# Patient Record
Sex: Male | Born: 1965 | Race: White | Hispanic: No | Marital: Married | Smoking: Never smoker
Health system: Southern US, Community
[De-identification: ages and names within clinical notes are randomized; demographics above are authoritative.]

## PROBLEM LIST (undated history)

## (undated) DIAGNOSIS — E559 Vitamin D deficiency, unspecified: Secondary | ICD-10-CM

## (undated) DIAGNOSIS — F419 Anxiety disorder, unspecified: Secondary | ICD-10-CM

## (undated) DIAGNOSIS — I10 Essential (primary) hypertension: Secondary | ICD-10-CM

## (undated) DIAGNOSIS — E291 Testicular hypofunction: Secondary | ICD-10-CM

## (undated) DIAGNOSIS — E785 Hyperlipidemia, unspecified: Secondary | ICD-10-CM

## (undated) DIAGNOSIS — K5190 Ulcerative colitis, unspecified, without complications: Secondary | ICD-10-CM

## (undated) HISTORY — DX: Ulcerative colitis, unspecified, without complications: K51.90

## (undated) HISTORY — DX: Anxiety disorder, unspecified: F41.9

## (undated) HISTORY — DX: Vitamin D deficiency, unspecified: E55.9

## (undated) HISTORY — DX: Testicular hypofunction: E29.1

## (undated) HISTORY — DX: Essential (primary) hypertension: I10

## (undated) HISTORY — DX: Hyperlipidemia, unspecified: E78.5

---

## 2011-02-10 ENCOUNTER — Ambulatory Visit
Admission: RE | Admit: 2011-02-10 | Discharge: 2011-02-10 | Disposition: A | Discharge status: Home / Self Care | Payer: BC Managed Care – PPO | Source: Ambulatory Visit | Attending: Internal Medicine | Admitting: Internal Medicine

## 2011-02-10 ENCOUNTER — Other Ambulatory Visit: Payer: Self-pay | Admitting: Internal Medicine

## 2011-02-10 DIAGNOSIS — K921 Melena: Secondary | ICD-10-CM

## 2011-02-10 DIAGNOSIS — R1032 Left lower quadrant pain: Secondary | ICD-10-CM

## 2011-02-10 MED ORDER — IOHEXOL 300 MG/ML  SOLN
100.0000 mL | Freq: Once | INTRAMUSCULAR | Status: AC | PRN
  Administered 2011-02-10: 100 mL via INTRAVENOUS

## 2013-02-12 IMAGING — CT CT ABD-PELV W/ CM
1 of 3 series · 14 of 32 positions shown, 19 images · IV contrast ([ID] OMNI 300)
Comparison: None.

CLINICAL DATA: Left lower quadrant pain, blood in stool, history of
colitis

CT ABDOMEN AND PELVIS WITH CONTRAST
TECHNIQUE: Multidetector CT imaging of the abdomen and pelvis was
performed following the standard protocol during bolus
administration of intravenous contrast.
Contrast: 100 ml Ymnipaque-OHH IV

[Series 2: abdomen w/ · axial · 0.83mm/px · z∈[-371,+24]mm · 14 of 88 slices shown, 19 images]
[im 5/88  soft-tissue]
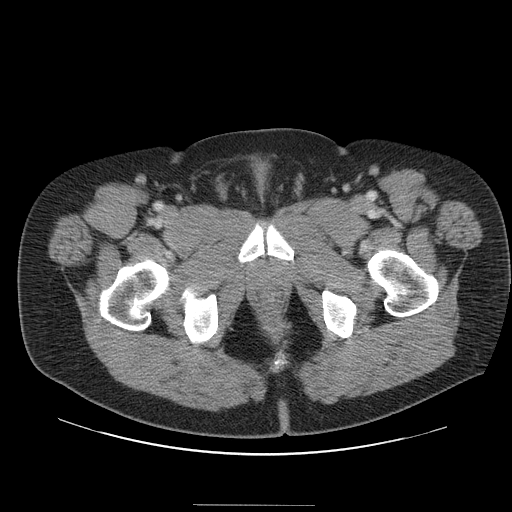
[im 5/88  bone]
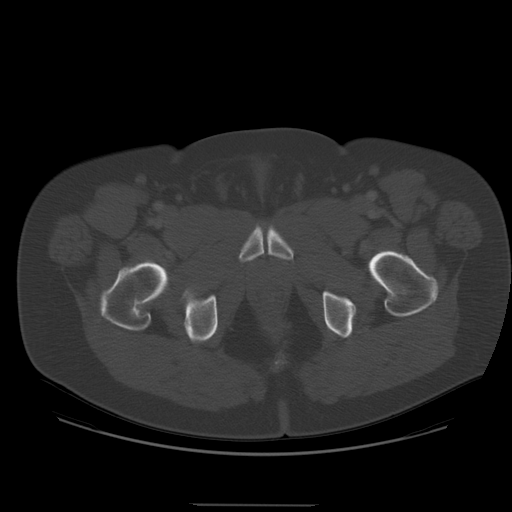
[im 14/88  soft-tissue]
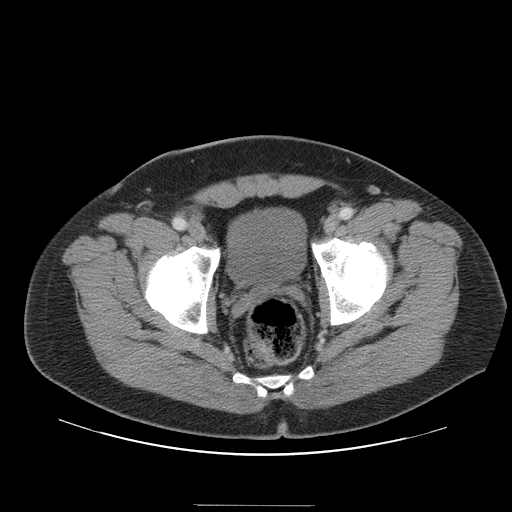
[im 19/88  soft-tissue]
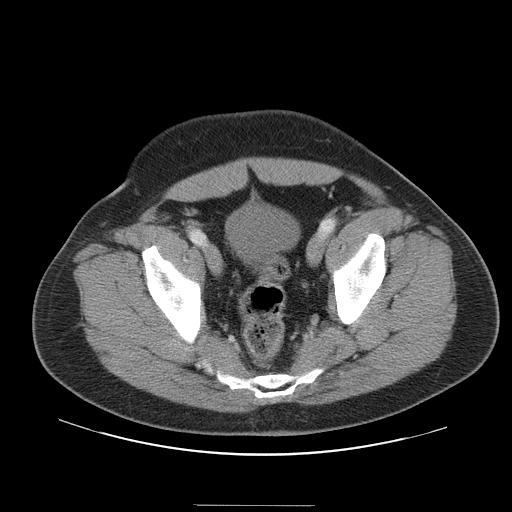
[im 23/88  soft-tissue]
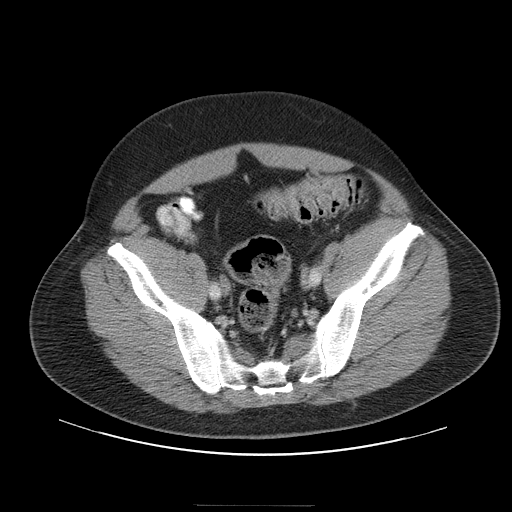
[im 33/88  soft-tissue]
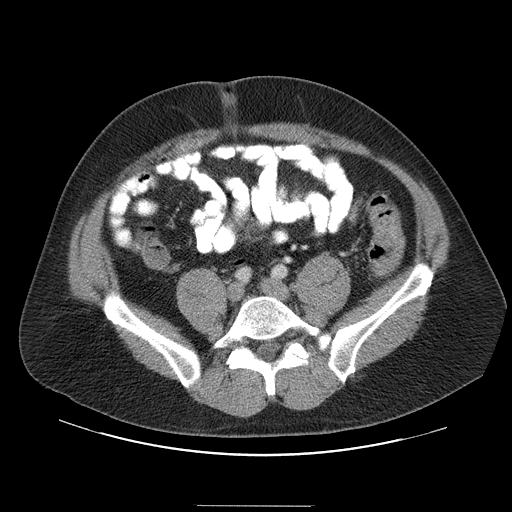
[im 37/88  soft-tissue]
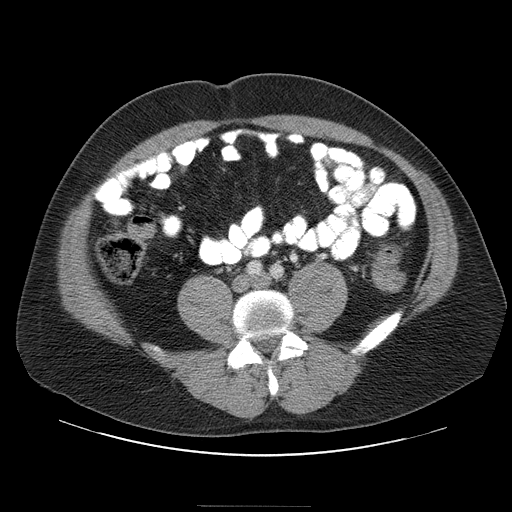
[im 46/88  soft-tissue]
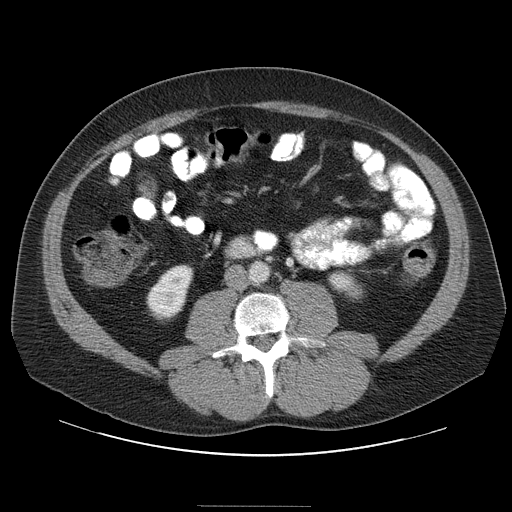
[im 51/88  soft-tissue]
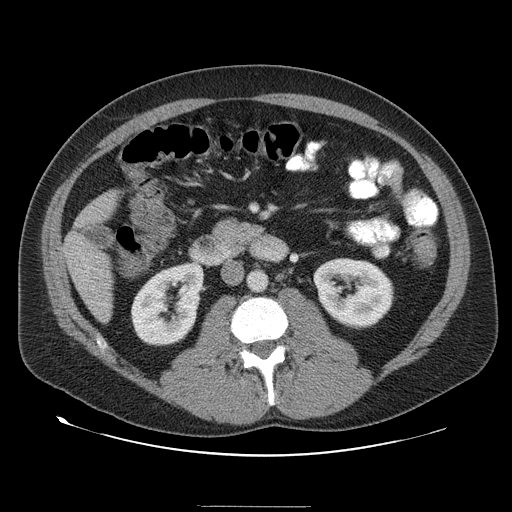
[im 55/88  soft-tissue]
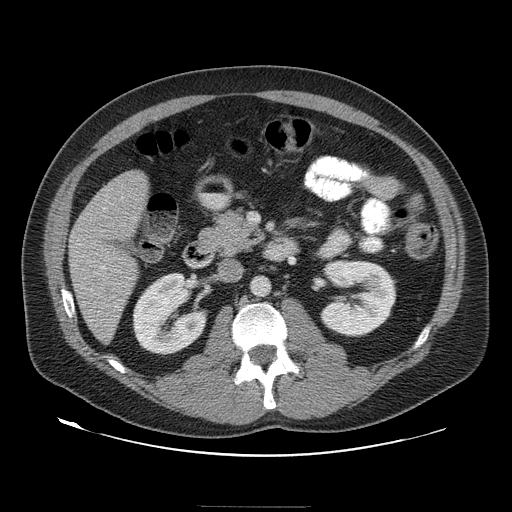
[im 55/88  bone]
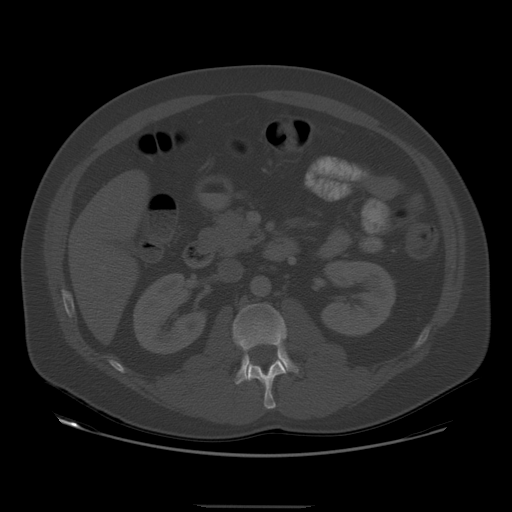
[im 65/88  soft-tissue]
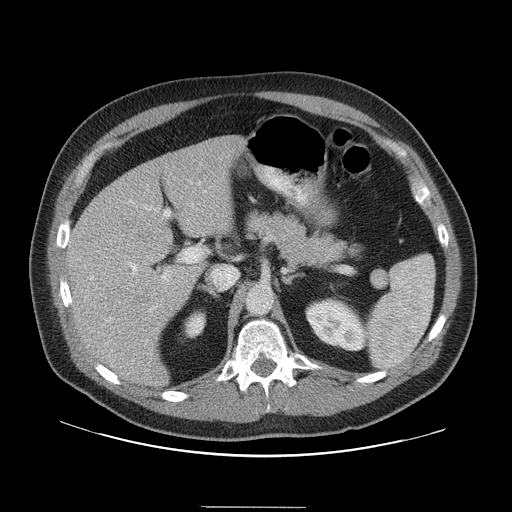
[im 69/88  soft-tissue]
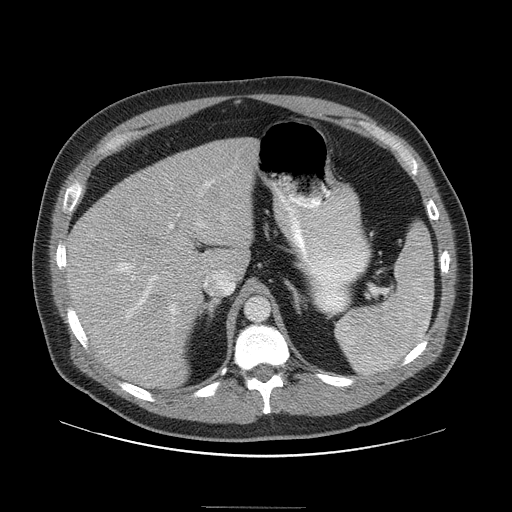
[im 69/88  lung]
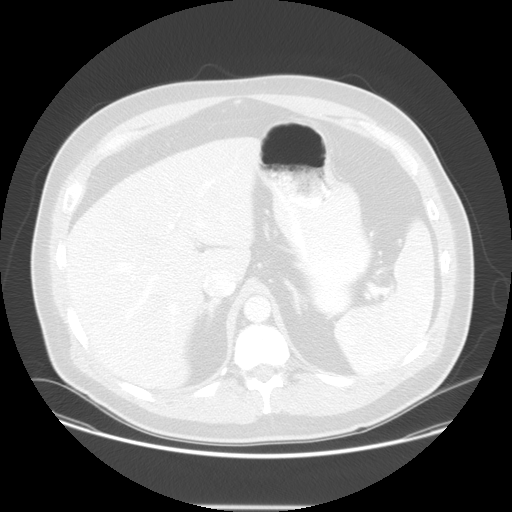
[im 74/88  soft-tissue]
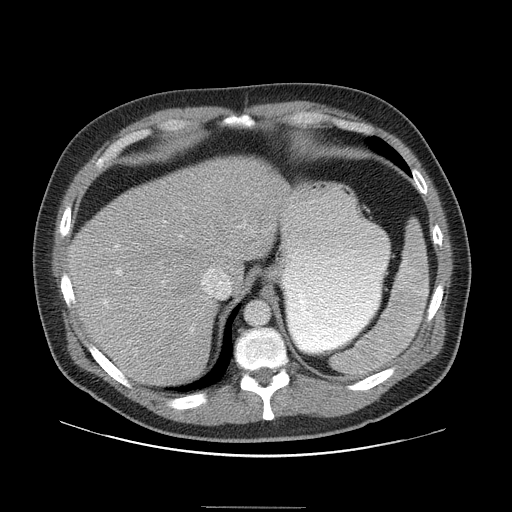
[im 74/88  lung]
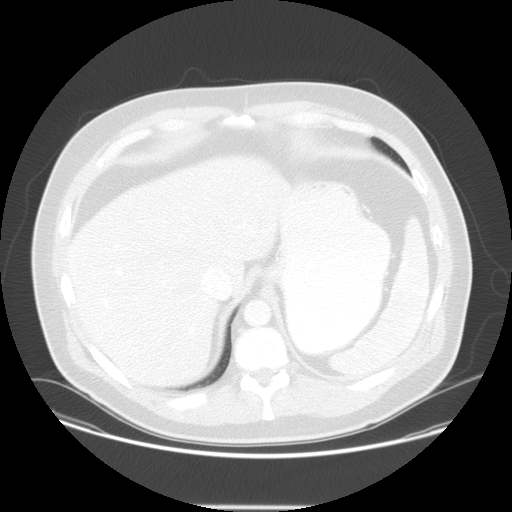
[im 78/88  lung]
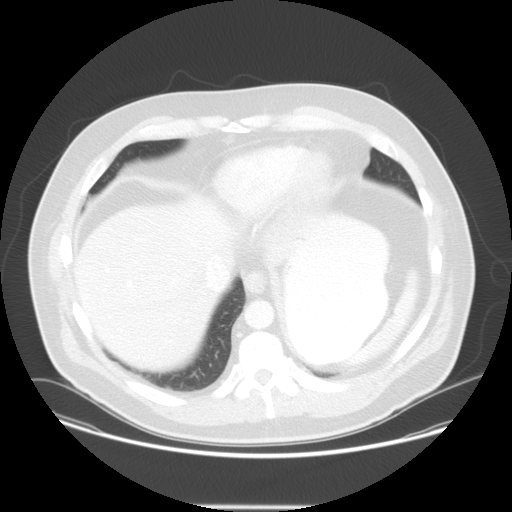
[im 83/88  soft-tissue]
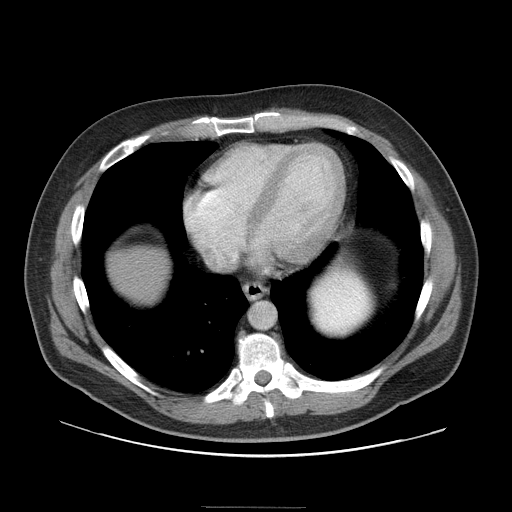
[im 83/88  lung]
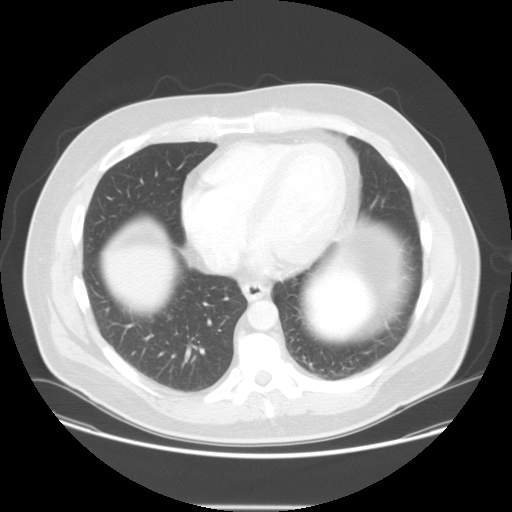

[14 of 32 positions shown; findings below may reference images not displayed]

FINDINGS: Lung bases are essentially clear.

Liver, spleen, pancreas, and adrenal glands are within normal
limits.

Gallbladder is contracted.  No intrahepatic or extrahepatic ductal
dilatation.

Kidneys are within normal limits.  No hydronephrosis.

No evidence of bowel obstruction.  Normal appendix.  Colonic
diverticulosis, without associated inflammatory changes.  No
colonic wall thickening.

No abdominopelvic ascites.  No suspicious abdominopelvic
lymphadenopathy.

No evidence of abdominal aortic aneurysm.

Prostate is unremarkable.

Bladder is within normal limits.

Visualized osseous structures are within normal limits.
IMPRESSION: Colonic diverticulosis, without associated inflammatory changes.
No colonic wall thickening.

No evidence of bowel obstruction.  Normal appendix.

No CT findings to account for the patient's abdominal symptoms.

## 2013-06-05 ENCOUNTER — Encounter: Payer: Self-pay | Admitting: Physician Assistant

## 2013-06-05 DIAGNOSIS — F419 Anxiety disorder, unspecified: Secondary | ICD-10-CM | POA: Insufficient documentation

## 2013-06-05 DIAGNOSIS — E559 Vitamin D deficiency, unspecified: Secondary | ICD-10-CM

## 2013-06-05 DIAGNOSIS — E782 Mixed hyperlipidemia: Secondary | ICD-10-CM | POA: Insufficient documentation

## 2013-06-05 DIAGNOSIS — E785 Hyperlipidemia, unspecified: Secondary | ICD-10-CM

## 2013-06-05 DIAGNOSIS — I10 Essential (primary) hypertension: Secondary | ICD-10-CM | POA: Insufficient documentation

## 2013-06-07 ENCOUNTER — Ambulatory Visit (INDEPENDENT_AMBULATORY_CARE_PROVIDER_SITE_OTHER): Payer: BC Managed Care – PPO | Admitting: Physician Assistant

## 2013-06-07 ENCOUNTER — Encounter: Payer: Self-pay | Admitting: Physician Assistant

## 2013-06-07 VITALS — BP 132/82 | HR 64 | Temp 98.2°F | Resp 16 | Ht 69.0 in | Wt 228.0 lb

## 2013-06-07 DIAGNOSIS — I10 Essential (primary) hypertension: Secondary | ICD-10-CM

## 2013-06-07 DIAGNOSIS — F419 Anxiety disorder, unspecified: Secondary | ICD-10-CM

## 2013-06-07 DIAGNOSIS — E559 Vitamin D deficiency, unspecified: Secondary | ICD-10-CM

## 2013-06-07 DIAGNOSIS — E785 Hyperlipidemia, unspecified: Secondary | ICD-10-CM

## 2013-06-07 MED ORDER — VITAMIN D (ERGOCALCIFEROL) 1.25 MG (50000 UNIT) PO CAPS: 50000.0000 [IU] | ORAL_CAPSULE | ORAL | Status: DC

## 2013-06-07 MED ORDER — PRAVASTATIN SODIUM 80 MG PO TABS: 80.0000 mg | ORAL_TABLET | Freq: Every day | ORAL | Status: DC

## 2013-06-07 MED ORDER — ALPRAZOLAM 1 MG PO TABS: 1.0000 mg | ORAL_TABLET | Freq: Three times a day (TID) | ORAL | Status: DC | PRN

## 2013-06-07 MED ORDER — LISINOPRIL 20 MG PO TABS: 20.0000 mg | ORAL_TABLET | Freq: Every day | ORAL | Status: DC

## 2013-06-07 NOTE — Patient Instructions (Signed)

## 2013-06-07 NOTE — Progress Notes (Signed)
HPI Patient presents for 3 month follow up with hypertension, hyperlipidemia, prediabetes and vitamin D. Patient's blood pressure has been controlled at home. Patient denies chest pain, shortness of breath, dizziness.  Patient's cholesterol is diet controlled. In addition he is on Zetia and pravastatin  and denies myalgias. The cholesterol last visit was 108, trigs 208. Testosterone was 159 last visit, he was not taking testim then but he is on it currently.  Patient is on Vitamin D supplement.  Current Medications:    Medication List       This list is accurate as of: 06/07/13 11:11 AM.  Always use your most recent med list.               ALPRAZolam 1 MG tablet  Commonly known as:  XANAX  Take 1 tablet (1 mg total) by mouth 3 (three) times daily as needed for anxiety.     ezetimibe 10 MG tablet  Commonly known as:  ZETIA  Take 10 mg by mouth daily.     labetalol 200 MG tablet  Commonly known as:  NORMODYNE  Take 200 mg by mouth 2 (two) times daily.     lisinopril 20 MG tablet  Commonly known as:  PRINIVIL,ZESTRIL  Take 1 tablet (20 mg total) by mouth daily.     pravastatin 80 MG tablet  Commonly known as:  PRAVACHOL  Take 1 tablet (80 mg total) by mouth daily. 1/2 daily     sulfaSALAzine 500 MG tablet  Commonly known as:  AZULFIDINE  Take 500 mg by mouth 4 (four) times daily.     TESTIM TD  Place onto the skin.     Vitamin D (Ergocalciferol) 50000 UNITS Caps capsule  Commonly known as:  DRISDOL  Take 1 capsule (50,000 Units total) by mouth every 7 (seven) days.       Medical History:  Past Medical History  Diagnosis Date  . Hypertension   . Hyperlipidemia   . Anxiety   . Vitamin D deficiency   . Colitis, ulcerative   . Hypogonadism male    Allergies:  Allergies  Allergen Reactions  . Crestor [Rosuvastatin]     myalgias  . Lipitor [Atorvastatin]     Myalgias    ROS Constitutional: Denies fever, chills, weight loss/gain, headaches, insomnia, fatigue,  night sweats, and change in appetite. Eyes: Denies redness, blurred vision, diplopia, discharge, itchy, watery eyes.  ENT: Denies discharge, congestion, post nasal drip, sore throat, earache, dental pain, Tinnitus, Vertigo, Sinus pain, snoring.  Cardio: Denies chest pain, palpitations, irregular heartbeat,  dyspnea, diaphoresis, orthopnea, PND, claudication, edema Respiratory: denies cough, dyspnea,pleurisy, hoarseness, wheezing.  Gastrointestinal: Denies dysphagia, heartburn,  water brash, pain, cramps, nausea, vomiting, bloating, diarrhea, constipation, hematemesis, melena, hematochezia,  hemorrhoids Genitourinary: Denies dysuria, frequency, urgency, nocturia, hesitancy, discharge, hematuria, flank pain Musculoskeletal: Denies arthralgia, myalgia, stiffness, Jt. Swelling, pain, limp, and strain/sprain. Skin: Denies pruritis, rash, hives, warts, acne, eczema, changing in skin lesion Neuro: Denies Weakness, tremor, incoordination, spasms, paresthesia, pain Psychiatric: Denies confusion, memory loss, sensory loss Endocrine: Denies change in weight, skin, hair change, nocturia, and paresthesia, Diabetic Polys, Denies visual blurring, hyper /hypo glycemic episodes.  Heme/Lymph: Denies Excessive bleeding, bruising, enlarged lymph nodes  Family history- Review and unchanged Social history- Review and unchanged Physical Exam: Filed Vitals:   06/07/13 1044  BP: 132/82  Pulse: 64  Temp: 98.2 F (36.8 C)  Resp: 16   Filed Weights   06/07/13 1044  Weight: 228 lb (103.42 kg)   General  Appearance: Well nourished, in no apparent distress. Eyes: PERRLA, EOMs, conjunctiva no swelling or erythema, normal fundi and vessels. Sinuses: No Frontal/maxillary tenderness ENT/Mouth: Ext aud canals clear, with TMs without erythema, bulging.No erythema, swelling, or exudate on post pharynx.  Tonsils not swollen or erythematous. Hearing normal.  Neck: Supple, thyroid normal.  Respiratory: Respiratory effort  normal, BS equal bilaterally without rales, rhonchi, wheezing or stridor.  Cardio: Heart sounds normal, regular rate and rhythm without murmurs, rubs or gallops. Peripheral pulses brisk and equal bilaterally, without edema.  Abdomen: Flat, soft, with bowel sounds. Non tender, no guarding, rebound, hernias, masses, or organomegaly.  Lymphatics: Non tender without lymphadenopathy.  Musculoskeletal: Full ROM all peripheral extremities, joint stability, 5/5 strength, and normal gait. Skin: Warm, dry without rashes, lesions, ecchymosis.  Neuro: Cranial nerves intact, reflexes equal bilaterally. Normal muscle tone, no cerebellar symptoms. Sensation intact.  Psych: Awake and oriented X 3, normal affect, Insight and Judgment appropriate.   Assessment and Plan:  Hypertension: Continue medication, monitor blood pressure at home. Continue DASH diet. Cholesterol: Continue diet and exercise. Pre-diabetes-Continue diet and exercise.  Vitamin D Def-  continue medications.  Refill medications and diet counseled.   Zachary Peters 10:56 AM

## 2013-09-13 ENCOUNTER — Ambulatory Visit: Payer: Self-pay | Admitting: Internal Medicine

## 2013-10-11 ENCOUNTER — Ambulatory Visit (INDEPENDENT_AMBULATORY_CARE_PROVIDER_SITE_OTHER): Payer: BC Managed Care – PPO | Admitting: Internal Medicine

## 2013-10-11 ENCOUNTER — Encounter: Payer: Self-pay | Admitting: Internal Medicine

## 2013-10-11 VITALS — BP 110/76 | HR 72 | Temp 98.1°F | Resp 16 | Ht 69.0 in | Wt 224.4 lb

## 2013-10-11 DIAGNOSIS — R7303 Prediabetes: Secondary | ICD-10-CM | POA: Insufficient documentation

## 2013-10-11 DIAGNOSIS — K5190 Ulcerative colitis, unspecified, without complications: Secondary | ICD-10-CM

## 2013-10-11 DIAGNOSIS — E782 Mixed hyperlipidemia: Secondary | ICD-10-CM

## 2013-10-11 DIAGNOSIS — E559 Vitamin D deficiency, unspecified: Secondary | ICD-10-CM | POA: Insufficient documentation

## 2013-10-11 DIAGNOSIS — Z79899 Other long term (current) drug therapy: Secondary | ICD-10-CM

## 2013-10-11 DIAGNOSIS — R7309 Other abnormal glucose: Secondary | ICD-10-CM

## 2013-10-11 DIAGNOSIS — E785 Hyperlipidemia, unspecified: Secondary | ICD-10-CM

## 2013-10-11 DIAGNOSIS — I10 Essential (primary) hypertension: Secondary | ICD-10-CM

## 2013-10-11 DIAGNOSIS — Z1211 Encounter for screening for malignant neoplasm of colon: Secondary | ICD-10-CM | POA: Insufficient documentation

## 2013-10-11 LAB — CBC WITH DIFFERENTIAL/PLATELET
BASOS PCT: 1 % (ref 0–1)
Basophils Absolute: 0.1 10*3/uL (ref 0.0–0.1)
EOS ABS: 0.1 10*3/uL (ref 0.0–0.7)
Eosinophils Relative: 2 % (ref 0–5)
HCT: 42.4 % (ref 39.0–52.0)
Hemoglobin: 14.9 g/dL (ref 13.0–17.0)
Lymphocytes Relative: 31 % (ref 12–46)
Lymphs Abs: 2.2 10*3/uL (ref 0.7–4.0)
MCH: 31.2 pg (ref 26.0–34.0)
MCHC: 35.1 g/dL (ref 30.0–36.0)
MCV: 88.7 fL (ref 78.0–100.0)
MONO ABS: 0.5 10*3/uL (ref 0.1–1.0)
Monocytes Relative: 7 % (ref 3–12)
NEUTROS ABS: 4.1 10*3/uL (ref 1.7–7.7)
NEUTROS PCT: 59 % (ref 43–77)
Platelets: 236 10*3/uL (ref 150–400)
RBC: 4.78 MIL/uL (ref 4.22–5.81)
RDW: 13.4 % (ref 11.5–15.5)
WBC: 7 10*3/uL (ref 4.0–10.5)

## 2013-10-11 MED ORDER — LABETALOL HCL 200 MG PO TABS: 200.0000 mg | ORAL_TABLET | Freq: Two times a day (BID) | ORAL | Status: DC

## 2013-10-11 MED ORDER — ALPRAZOLAM 1 MG PO TABS: 1.0000 mg | ORAL_TABLET | Freq: Three times a day (TID) | ORAL | Status: DC | PRN

## 2013-10-11 MED ORDER — VITAMIN D (ERGOCALCIFEROL) 1.25 MG (50000 UNIT) PO CAPS: 50000.0000 [IU] | ORAL_CAPSULE | Freq: Every day | ORAL | Status: DC

## 2013-10-11 NOTE — Progress Notes (Signed)
Patient ID: Zachary Peters, male   DOB: May 14, 1966, 48 y.o.   MRN: [id number]    This very nice 48 y.o. MWM presents for 6 month follow up with Hypertension, Hyperlipidemia, Pre-Diabetes and Vitamin D Deficiency.    HTN predates since 2003. BP has been controlled at home. Today's BP: 110/76 mmHg . Patient denies any cardiac type chest pain, palpitations, dyspnea/orthopnea/PND, dizziness, claudication, or dependent edema.   Hyperlipidemia is controlled with diet & meds. Last Cholesterol was 201, Triglycerides were 209, HDL 51 and LDL 108 in SEpt 2014 - slightly above goal. Patient denies myalgias or other med SE's.    Also, the patient has moderate obesity (BMI 33.4) and therefore is screened for PreDiabetes and insulin resistance with last A1c of 5.5% in Sept 2014. Patient denies any symptoms of reactive hypoglycemia, diabetic polys, paresthesias or visual blurring.   Other problems include Ulcerative Colitis predating to 1993 and for which he take Azulfadine sporaticaly and infrequently if fhe developes mucoid diarrheal stools. His last colonoscopy was in 2008 with Dr Samuel JesterJLEdwards. Further, the patient has history of Vitamin D Deficiency of 20 in 2008 and with last vitamin D of 55 in Sept 2014. Patient supplements vitamin D without any suspected side-effects. Lastly the patient has Hx/o Testosterone Deficiency , but he states his new wife sayd to inform me that he doesn't have a deficiency!  Medication Sig  . ezetimibe (ZETIA) 10 MG tablet Take 10 mg by mouth daily.  Marland Kitchen. lisinopril (PRINIVIL,ZESTRIL) 20 MG tablet Take 1 tablet (20 mg total) by mouth daily.  . pravastatin (PRAVACHOL) 80 MG tablet Take 1 tablet (80 mg total) by mouth daily. 1/2 daily  . sulfaSALAzine (AZULFIDINE) 500 MG tablet Take 500 mg by mouth 4 (four) times daily. Takes prn   Allergies  Allergen Reactions  . Crestor [Rosuvastatin]     myalgias  . Lipitor [Atorvastatin]     Myalgias   PMHx:   Past Medical History  Diagnosis  Date  . Hypertension   . Hyperlipidemia   . Anxiety   . Vitamin D deficiency   . Colitis, ulcerative   . Hypogonadism male    FHx:    Reviewed / unchanged  SHx:    Reviewed / unchanged   Systems Review: Constitutional: Denies fever, chills, wt changes, headaches, insomnia, fatigue, night sweats, change in appetite. Eyes: Denies redness, blurred vision, diplopia, discharge, itchy, watery eyes.  ENT: Denies discharge, congestion, post nasal drip, epistaxis, sore throat, earache, hearing loss, dental pain, tinnitus, vertigo, sinus pain, snoring.  CV: Denies chest pain, palpitations, irregular heartbeat, syncope, dyspnea, diaphoresis, orthopnea, PND, claudication, edema. Respiratory: denies cough, dyspnea, DOE, pleurisy, hoarseness, laryngitis, wheezing.  Gastrointestinal: Denies dysphagia, odynophagia, heartburn, reflux, water brash, abdominal pain or cramps, nausea, vomiting, bloating, diarrhea, constipation, hematemesis, melena, hematochezia,  or hemorrhoids. Genitourinary: Denies dysuria, frequency, urgency, nocturia, hesitancy, discharge, hematuria, flank pain. Musculoskeletal: Denies arthralgias, myalgias, stiffness, jt. swelling, pain, limp, strain/sprain.  Skin: Denies pruritus, rash, hives, warts, acne, eczema, change in skin lesion(s). Neuro: No weakness, tremor, incoordination, spasms, paresthesia, or pain. Psychiatric: Denies confusion, memory loss, or sensory loss. Endo: Denies change in weight, skin, hair change.  Heme/Lymph: No excessive bleeding, bruising, orenlarged lymph nodes.   Exam:    BP 110/76  Pulse 72  Temp 98.1 F   Resp 16  Ht 5\' 9"    Wt 224 lb 6.4 oz   BMI 33.12 kg/m2  Appears well nourished - in no distress. Eyes: PERRLA, EOMs, conjunctiva no swelling or  erythema. Sinuses: No frontal/maxillary tenderness ENT/Mouth: EAC's clear, TM's nl w/o erythema, bulging. Nares clear w/o erythema, swelling, exudates. Oropharynx clear without erythema or exudates.  Oral hygiene is good. Tongue normal, non obstructing. Hearing intact.  Neck: Supple. Thyroid nl. Car 2+/2+ without bruits, nodes or JVD. Chest: Respirations nl with BS clear & equal w/o rales, rhonchi, wheezing or stridor.  Cor: Heart sounds normal w/ regular rate and rhythm without sig. murmurs, gallops, clicks, or rubs. Peripheral pulses normal and equal  without edema.  Abdomen: Soft & bowel sounds normal. Non-tender w/o guarding, rebound, hernias, masses, or organomegaly.  Lymphatics: Unremarkable.  Musculoskeletal: Full ROM all peripheral extremities, joint stability, 5/5 strength, and normal gait.  Skin: Warm, dry without exposed rashes, lesions, ecchymosis apparent.  Neuro: Cranial nerves intact, reflexes equal bilaterally. Sensory-motor testing grossly intact. Tendon reflexes grossly intact.  Pysch: Alert & oriented x 3. Insight and judgement nl & appropriate. No ideations.  Assessment and Plan:  1. Hypertension - Continue monitor blood pressure at home. Continue diet/meds same.  2. Hyperlipidemia - Continue diet/meds, exercise,& lifestyle modifications. Continue monitor periodic cholesterol/liver & renal functions   3. Pre-diabetes/Insulin Resistance - Continue diet, exercise, lifestyle modifications. Monitor appropriate labs.  4. Vitamin D Deficiency - Continue supplementation.  5. Hx/o Ulcerative Colitis - last colon in 2008 - will query Dr Randa Evens office as to whether he is due surveillance colonoscopy.  Recommended regular exercise, BP monitoring, weight control, and discussed med and SE's. Recommended labs to assess and monitor clinical status. Further disposition pending results of labs.

## 2013-10-11 NOTE — Patient Instructions (Signed)

## 2013-10-12 LAB — HEPATIC FUNCTION PANEL
ALT: 31 U/L (ref 0–53)
AST: 22 U/L (ref 0–37)
Albumin: 4.7 g/dL (ref 3.5–5.2)
Alkaline Phosphatase: 49 U/L (ref 39–117)
Bilirubin, Direct: 0.2 mg/dL (ref 0.0–0.3)
Indirect Bilirubin: 0.7 mg/dL (ref 0.2–1.2)
TOTAL PROTEIN: 6.9 g/dL (ref 6.0–8.3)
Total Bilirubin: 0.9 mg/dL (ref 0.2–1.2)

## 2013-10-12 LAB — BASIC METABOLIC PANEL WITH GFR
BUN: 15 mg/dL (ref 6–23)
CO2: 23 mEq/L (ref 19–32)
Calcium: 9.8 mg/dL (ref 8.4–10.5)
Chloride: 99 mEq/L (ref 96–112)
Creat: 0.73 mg/dL (ref 0.50–1.35)
GFR, Est Non African American: >89 mL/min
Glucose, Bld: 96 mg/dL (ref 70–99)
Potassium: 4.8 mEq/L (ref 3.5–5.3)
SODIUM: 135 meq/L (ref 135–145)

## 2013-10-12 LAB — LIPID PANEL
Cholesterol: 180 mg/dL (ref 0–200)
HDL: 50 mg/dL (ref >39)
LDL CALC: 95 mg/dL (ref 0–99)
Total CHOL/HDL Ratio: 3.6 Ratio
Triglycerides: 176 mg/dL — ABNORMAL HIGH (ref <150)
VLDL: 35 mg/dL (ref 0–40)

## 2013-10-12 LAB — HEMOGLOBIN A1C
Hgb A1c MFr Bld: 5.4 % (ref <5.7)
Mean Plasma Glucose: 108 mg/dL (ref <117)

## 2013-10-12 LAB — MAGNESIUM: Magnesium: 2.2 mg/dL (ref 1.5–2.5)

## 2013-10-12 LAB — TSH: TSH: 1.334 u[IU]/mL (ref 0.350–4.500)

## 2013-10-12 LAB — INSULIN, FASTING: INSULIN FASTING, SERUM: 11 u[IU]/mL (ref 3–28)

## 2013-10-12 LAB — VITAMIN D 25 HYDROXY (VIT D DEFICIENCY, FRACTURES): Vit D, 25-Hydroxy: 51 ng/mL (ref 30–89)

## 2014-01-13 ENCOUNTER — Ambulatory Visit: Payer: Self-pay | Admitting: Emergency Medicine

## 2014-04-01 ENCOUNTER — Encounter: Payer: Self-pay | Admitting: Internal Medicine

## 2014-08-29 ENCOUNTER — Other Ambulatory Visit: Payer: Self-pay | Admitting: *Deleted

## 2014-08-29 ENCOUNTER — Ambulatory Visit: Payer: BLUE CROSS/BLUE SHIELD | Admitting: Internal Medicine

## 2014-08-29 ENCOUNTER — Encounter: Payer: Self-pay | Admitting: Internal Medicine

## 2014-08-29 VITALS — BP 110/70 | HR 60 | Temp 97.7°F | Resp 16 | Ht 70.25 in | Wt 217.6 lb

## 2014-08-29 DIAGNOSIS — R7303 Prediabetes: Secondary | ICD-10-CM

## 2014-08-29 DIAGNOSIS — E785 Hyperlipidemia, unspecified: Secondary | ICD-10-CM

## 2014-08-29 DIAGNOSIS — Z1212 Encounter for screening for malignant neoplasm of rectum: Secondary | ICD-10-CM

## 2014-08-29 DIAGNOSIS — R5383 Other fatigue: Secondary | ICD-10-CM

## 2014-08-29 DIAGNOSIS — Z1211 Encounter for screening for malignant neoplasm of colon: Secondary | ICD-10-CM

## 2014-08-29 DIAGNOSIS — Z125 Encounter for screening for malignant neoplasm of prostate: Secondary | ICD-10-CM

## 2014-08-29 DIAGNOSIS — Z111 Encounter for screening for respiratory tuberculosis: Secondary | ICD-10-CM

## 2014-08-29 DIAGNOSIS — I10 Essential (primary) hypertension: Secondary | ICD-10-CM

## 2014-08-29 DIAGNOSIS — F419 Anxiety disorder, unspecified: Secondary | ICD-10-CM

## 2014-08-29 DIAGNOSIS — E559 Vitamin D deficiency, unspecified: Secondary | ICD-10-CM

## 2014-08-29 DIAGNOSIS — Z79899 Other long term (current) drug therapy: Secondary | ICD-10-CM

## 2014-08-29 LAB — CBC WITH DIFFERENTIAL/PLATELET
Basophils Absolute: 0.1 10*3/uL (ref 0.0–0.1)
Basophils Relative: 1 % (ref 0–1)
EOS ABS: 0.3 10*3/uL (ref 0.0–0.7)
EOS PCT: 4 % (ref 0–5)
HCT: 45.1 % (ref 39.0–52.0)
Hemoglobin: 15.4 g/dL (ref 13.0–17.0)
LYMPHS PCT: 31 % (ref 12–46)
Lymphs Abs: 2.2 10*3/uL (ref 0.7–4.0)
MCH: 30.9 pg (ref 26.0–34.0)
MCHC: 34.1 g/dL (ref 30.0–36.0)
MCV: 90.6 fL (ref 78.0–100.0)
MONO ABS: 0.6 10*3/uL (ref 0.1–1.0)
MPV: 9.6 fL (ref 8.6–12.4)
Monocytes Relative: 9 % (ref 3–12)
Neutro Abs: 3.9 10*3/uL (ref 1.7–7.7)
Neutrophils Relative %: 55 % (ref 43–77)
Platelets: 251 10*3/uL (ref 150–400)
RBC: 4.98 MIL/uL (ref 4.22–5.81)
RDW: 13.6 % (ref 11.5–15.5)
WBC: 7.1 10*3/uL (ref 4.0–10.5)

## 2014-08-29 LAB — BASIC METABOLIC PANEL WITH GFR
BUN: 11 mg/dL (ref 6–23)
CHLORIDE: 101 meq/L (ref 96–112)
CO2: 25 mEq/L (ref 19–32)
CREATININE: 0.84 mg/dL (ref 0.50–1.35)
Calcium: 9.7 mg/dL (ref 8.4–10.5)
GFR, Est African American: >89 mL/min
GFR, Est Non African American: >89 mL/min
Glucose, Bld: 87 mg/dL (ref 70–99)
Potassium: 4.5 mEq/L (ref 3.5–5.3)
Sodium: 138 mEq/L (ref 135–145)

## 2014-08-29 LAB — IRON AND TIBC
%SAT: 27 % (ref 20–55)
Iron: 106 ug/dL (ref 42–165)
TIBC: 397 ug/dL (ref 215–435)
UIBC: 291 ug/dL (ref 125–400)

## 2014-08-29 LAB — LIPID PANEL
Cholesterol: 190 mg/dL (ref 0–200)
HDL: 52 mg/dL (ref >=40)
LDL CALC: 107 mg/dL — AB (ref 0–99)
TRIGLYCERIDES: 156 mg/dL — AB (ref <150)
Total CHOL/HDL Ratio: 3.7 Ratio
VLDL: 31 mg/dL (ref 0–40)

## 2014-08-29 LAB — HEPATIC FUNCTION PANEL
ALBUMIN: 4.9 g/dL (ref 3.5–5.2)
ALT: 25 U/L (ref 0–53)
AST: 17 U/L (ref 0–37)
Alkaline Phosphatase: 57 U/L (ref 39–117)
Bilirubin, Direct: 0.2 mg/dL (ref 0.0–0.3)
Indirect Bilirubin: 1.1 mg/dL (ref 0.2–1.2)
Total Bilirubin: 1.3 mg/dL — ABNORMAL HIGH (ref 0.2–1.2)
Total Protein: 7.6 g/dL (ref 6.0–8.3)

## 2014-08-29 LAB — VITAMIN B12: Vitamin B-12: 569 pg/mL (ref 211–911)

## 2014-08-29 LAB — TSH: TSH: 1.45 u[IU]/mL (ref 0.350–4.500)

## 2014-08-29 LAB — MAGNESIUM: Magnesium: 2.3 mg/dL (ref 1.5–2.5)

## 2014-08-29 MED ORDER — LISINOPRIL 20 MG PO TABS: 20.0000 mg | ORAL_TABLET | Freq: Every day | ORAL | Status: DC

## 2014-08-29 MED ORDER — ALPRAZOLAM 1 MG PO TABS: 1.0000 mg | ORAL_TABLET | Freq: Three times a day (TID) | ORAL | Status: DC | PRN

## 2014-08-29 MED ORDER — PRAVASTATIN SODIUM 80 MG PO TABS: ORAL_TABLET | ORAL | Status: DC

## 2014-08-29 MED ORDER — VITAMIN D (ERGOCALCIFEROL) 1.25 MG (50000 UNIT) PO CAPS: 50000.0000 [IU] | ORAL_CAPSULE | Freq: Every day | ORAL | Status: DC

## 2014-08-29 NOTE — Patient Instructions (Signed)

## 2014-08-30 LAB — URINALYSIS, MICROSCOPIC ONLY
BACTERIA UA: NONE SEEN
CASTS: NONE SEEN
Crystals: NONE SEEN
SQUAMOUS EPITHELIAL / LPF: NONE SEEN

## 2014-08-30 LAB — HEMOGLOBIN A1C
HEMOGLOBIN A1C: 5.2 % (ref <5.7)
Mean Plasma Glucose: 103 mg/dL (ref <117)

## 2014-08-30 LAB — INSULIN, FASTING: Insulin fasting, serum: 4.8 u[IU]/mL (ref 2.0–19.6)

## 2014-08-30 LAB — MICROALBUMIN / CREATININE URINE RATIO
Creatinine, Urine: 164.8 mg/dL
MICROALB UR: 0.6 mg/dL (ref <2.0)
Microalb Creat Ratio: 3.6 mg/g (ref 0.0–30.0)

## 2014-08-30 LAB — PSA: PSA: 0.39 ng/mL (ref <=4.00)

## 2014-08-30 LAB — TESTOSTERONE: Testosterone: 240 ng/dL — ABNORMAL LOW (ref 300–890)

## 2014-08-30 LAB — VITAMIN D 25 HYDROXY (VIT D DEFICIENCY, FRACTURES): VIT D 25 HYDROXY: 31 ng/mL (ref 30–100)

## 2014-08-31 NOTE — Progress Notes (Signed)
Patient ID: Orlie DakinKeith D Cauthon, male   DOB: 1966/01/29, 49 y.o.   MRN: [id number] Annual Comprehensive Examination  This very nice 49 y.o. reMWM presents for complete physical.  Patient has been followed for HTN, Prediabetes, Hyperlipidemia, remote Ulc Colitis and Vitamin D Deficiency.   HTN predates since 2003.  Patient's BP has been controlled at home.Today's BP: 110/70 mmHg. Patient denies any cardiac symptoms as chest pain, palpitations, shortness of breath, dizziness or ankle swelling.   Patient's hyperlipidemia is controlled with diet and medications. Patient denies myalgias or other medication SE's. Last lipids were near goal - Total Chol 190; HDL 52; LDL 107; Trig 156 on 08/29/2014   Patient is screened for prediabetes and patient denies reactive hypoglycemic symptoms, visual blurring, diabetic polys or paresthesias. Last A1c was 5.2% on  08/29/2014.   Finally, patient has history of Vitamin D Deficiency of 20 in and last vitamin D was still very low at 31 on 08/29/2014.  Medication Sig  . ezetimibe (ZETIA) 10 MG tablet Take 10 mg by mouth daily.  Marland Kitchen. labetalol (NORMODYNE) 200 MG tablet Take 1 tablet (200 mg total) by mouth 2 (two) times daily.  Marland Kitchen. ALPRAZolam (XANAX) 1 MG tablet Take 1 tablet (1 mg total) by mouth 3 (three) times daily as needed for anxiety.  Marland Kitchen. lisinopril (PRINIVIL,ZESTRIL) 20 MG tablet Take 1 tablet (20 mg total) by mouth daily.  . pravastatin (PRAVACHOL) 80 MG tablet Take 1 tablet (80 mg total) by mouth daily. 1/2 daily  . Vitamin D, Ergocalciferol, (DRISDOL) 50000 UNITS CAPS capsule Take 1 capsule (50,000 Units total) by mouth daily.  Marland Kitchen. sulfaSALAzine (AZULFIDINE) 500 MG tablet Take 500 mg by mouth 4 (four) times daily. Takes prn   Allergies  Allergen Reactions  . Crestor [Rosuvastatin]     myalgias  . Lipitor [Atorvastatin]     Myalgias   Past Medical History  Diagnosis Date  . Hypertension   . Hyperlipidemia   . Anxiety   . Vitamin D deficiency   . Colitis,  ulcerative   . Hypogonadism male    Health Maintenance  Topic Date Due  . HIV Screening  03/27/1980  . INFLUENZA VACCINE  02/01/2014  . TETANUS/TDAP  07/06/2018   Immunization History  Administered Date(s) Administered  . Pneumococcal-Unspecified 06/05/2002  . Tdap 07/06/2008   Family History  Problem Relation Age of Onset  . Cancer Mother     ocular  . Thyroid disease Mother   . Hypertension Father   . Hyperlipidemia Father    History   Social History  . Marital Status: Unknown    Spouse Name: N/A  . Number of Children: N/A  . Years of Education: N/A   Occupational History  .  medical parts supply inspector   Social History Main Topics  . Smoking status: Never Smoker   . Smokeless tobacco: Never Used  . Alcohol Use: eoccasionally     Comment:   . Drug Use: No  . Sexual Activity: Yes    ROS Constitutional: Denies fever, chills, weight loss/gain, headaches, insomnia, fatigue, night sweats or change in appetite. Eyes: Denies redness, blurred vision, diplopia, discharge, itchy or watery eyes.  ENT: Denies discharge, congestion, post nasal drip, epistaxis, sore throat, earache, hearing loss, dental pain, Tinnitus, Vertigo, Sinus pain or snoring.  Cardio: Denies chest pain, palpitations, irregular heartbeat, syncope, dyspnea, diaphoresis, orthopnea, PND, claudication or edema Respiratory: denies cough, dyspnea, DOE, pleurisy, hoarseness, laryngitis or wheezing.  Gastrointestinal: Denies dysphagia, heartburn, reflux, water brash, pain, cramps, nausea,  vomiting, bloating, diarrhea, constipation, hematemesis, melena, hematochezia, jaundice or hemorrhoids Genitourinary: Denies dysuria, frequency, urgency, nocturia, hesitancy, discharge, hematuria or flank pain Musculoskeletal: Denies arthralgia, myalgia, stiffness, Jt. Swelling, pain, limp or strain/sprain. Denies Falls. Skin: Denies puritis, rash, hives, warts, acne, eczema or change in skin lesion Neuro: No weakness,  tremor, incoordination, spasms, paresthesia or pain Psychiatric: Denies confusion, memory loss or sensory loss. Denies Depression. Endocrine: Denies change in weight, skin, hair change, nocturia, and paresthesia, diabetic polys, visual blurring or hyper / hypo glycemic episodes.  Heme/Lymph: No excessive bleeding, bruising or enlarged lymph nodes.  Physical Exam  BP 110/70   Pulse 60  Temp 97.7 F   Resp 16  Ht 5' 10.25" Wt 217 lb 9.6 oz     BMI 31.01   General Appearance: Well nourished, in no apparent distress. Eyes: PERRLA, EOMs, conjunctiva no swelling or erythema, normal fundi and vessels. Sinuses: No frontal/maxillary tenderness ENT/Mouth: EACs patent / TMs  nl. Nares clear without erythema, swelling, mucoid exudates. Oral hygiene is good. No erythema, swelling, or exudate. Tongue normal, non-obstructing. Tonsils not swollen or erythematous. Hearing normal.  Neck: Supple, thyroid normal. No bruits, nodes or JVD. Respiratory: Respiratory effort normal.  BS equal and clear bilateral without rales, rhonci, wheezing or stridor. Cardio: Heart sounds are normal with regular rate and rhythm and no murmurs, rubs or gallops. Peripheral pulses are normal and equal bilaterally without edema. No aortic or femoral bruits. Chest: symmetric with normal excursions and percussion.  Abdomen: Flat, soft, with bowl sounds. Nontender, no guarding, rebound, hernias, masses, or organomegaly.  Lymphatics: Non tender without lymphadenopathy.  Genitourinary: No hernias.Testes nl. DRE - prostate nl for age - smooth & firm w/o nodules. Musculoskeletal: Full ROM all peripheral extremities, joint stability, 5/5 strength, and normal gait. Skin: Warm and dry without rashes, lesions, cyanosis, clubbing or  ecchymosis.  Neuro: Cranial nerves intact, reflexes equal bilaterally. Normal muscle tone, no cerebellar symptoms. Sensation intact.  Pysch: Awake and oriented X 3 with normal affect, insight and judgment  appropriate.   Assessment and Plan  1. Essential hypertension  - Microalbumin / creatinine urine ratio - EKG 12-Lead - [place], RETROPERITNL ABD,  LTD - TSH  2. Hyperlipidemia  - Lipid panel  3. Prediabetes  - Hemoglobin A1c - Insulin, fasting  4. Vitamin D deficiency  - Vit D  25 hydroxy (rtn osteoporosis monitoring)  5. Anxiety   6. Screening for rectal cancer  - POC Hemoccult Bld/Stl (3-Cd Home Screen); Future  7. Prostate cancer screening  - PSA  8. Medication management - Urine Microscopic - CBC with Differential/Platelet - BASIC METABOLIC PANEL WITH GFR - Hepatic function panel - Magnesium  9. Other fatigue  - Vitamin B12 - Testosterone - Iron and TIBC   Continue prudent diet as discussed, weight control, BP monitoring, regular exercise, and medications as discussed.  Discussed med effects and SE's. Routine screening labs and tests as requested with regular follow-up as recommended.

## 2014-09-01 ENCOUNTER — Telehealth: Payer: Self-pay | Admitting: *Deleted

## 2014-09-01 NOTE — Telephone Encounter (Signed)
caleed and requested verification of Vitamin D 1610950000 units daily.  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  Left message to inform [facility].

## 2014-09-01 NOTE — Addendum Note (Signed)
Addended by: Valrie HartEVANS, Levie Owensby C on: 09/01/2014 09:40 AM   Modules accepted: Orders

## 2014-09-04 LAB — TB SKIN TEST
Induration: 0 mm
TB SKIN TEST: NEGATIVE

## 2014-09-17 ENCOUNTER — Other Ambulatory Visit: Payer: Self-pay | Admitting: *Deleted

## 2014-09-17 DIAGNOSIS — Z1212 Encounter for screening for malignant neoplasm of rectum: Secondary | ICD-10-CM

## 2014-09-17 LAB — POC HEMOCCULT BLD/STL (HOME/3-CARD/SCREEN)
Card #2 Fecal Occult Blod, POC: NEGATIVE
Card #3 Fecal Occult Blood, POC: NEGATIVE
FECAL OCCULT BLD: NEGATIVE

## 2014-09-19 ENCOUNTER — Ambulatory Visit (INDEPENDENT_AMBULATORY_CARE_PROVIDER_SITE_OTHER): Payer: BLUE CROSS/BLUE SHIELD | Admitting: Internal Medicine

## 2014-09-19 ENCOUNTER — Encounter: Payer: Self-pay | Admitting: Internal Medicine

## 2014-09-19 VITALS — BP 140/88 | HR 62 | Temp 98.2°F | Resp 18 | Ht 70.25 in | Wt 222.0 lb

## 2014-09-19 DIAGNOSIS — R300 Dysuria: Secondary | ICD-10-CM

## 2014-09-19 MED ORDER — CIPROFLOXACIN HCL 500 MG PO TABS: 500.0000 mg | ORAL_TABLET | Freq: Two times a day (BID) | ORAL | Status: DC

## 2014-09-19 NOTE — Progress Notes (Signed)
   Subjective:    Patient ID: Zachary Peters, male    DOB: July 26, 1965, 49 y.o.   MRN: [id number]  Dysuria  Associated symptoms include chills, frequency, nausea and urgency. Pertinent negatives include no hematuria or vomiting.  Patient is a [age] y.o. Male who presents to the office for evaluation of dysuria since Monday.  Patient reports that his urine smells very strong.  He reports that he has the urge to go and he also reports that his urine has had a very strong smell.  He reports some slight burning as he urinates.  He has been taking Azo at home with some relief.  He reports a little bit of nausea but no vomiting.  He reports that his urine has been cloudy.  He reports that he had a UTI 2 to three years ago.  He reports some dribbling after urinating.  He denies penile discharge.  He does report some rectal discomfort when he is sitting.  He also reports some mild back pain which has since resolved.    Review of Systems  Constitutional: Positive for chills. Negative for fever and fatigue.  Gastrointestinal: Positive for nausea and diarrhea. Negative for vomiting, abdominal pain and constipation.  Genitourinary: Positive for dysuria, urgency, frequency and difficulty urinating. Negative for hematuria, decreased urine volume, discharge, penile pain and testicular pain.  All other systems reviewed and are negative.      Objective:   Physical Exam  Constitutional: He is oriented to person, place, and time. He appears well-developed and well-nourished. No distress.  HENT:  Head: Normocephalic and atraumatic.  Mouth/Throat: Oropharynx is clear and moist. No oropharyngeal exudate.  Eyes: Conjunctivae and EOM are normal. Pupils are equal, round, and reactive to light. No scleral icterus.  Neck: Normal range of motion. Neck supple. No JVD present. No thyromegaly present.  Cardiovascular: Normal rate, regular rhythm, normal heart sounds and intact distal pulses.   Pulmonary/Chest: Effort normal  and breath sounds normal. No respiratory distress. He has no wheezes. He has no rales. He exhibits no tenderness.  Abdominal: Soft. Normal appearance and bowel sounds are normal. He exhibits no distension and no mass. There is no tenderness. There is no rigidity, no rebound, no guarding, no CVA tenderness, no tenderness at McBurney's point and negative Murphy's sign.  Musculoskeletal: Normal range of motion.  Lymphadenopathy:    He has no cervical adenopathy.  Neurological: He is alert and oriented to person, place, and time.  Skin: Skin is warm and dry. He is not diaphoretic.  Psychiatric: He has a normal mood and affect. His behavior is normal. Judgment and thought content normal.  Nursing note and vitals reviewed.   Filed Vitals:   09/19/14 0915  BP: 140/88  Pulse: 62  Temp: 98.2 F (36.8 C)  Resp: 18          Assessment & Plan:    1. Dysuria  Ddx includes UTI, prostatitis, or kidney stone.  Will check urine and obtain culture.  Will treat with cipro.   Pt. Can take azo as needed.  Will check GC probe for possible urethritis but low risk given history.    - ciprofloxacin (CIPRO) 500 MG tablet; Take 1 tablet (500 mg total) by mouth 2 (two) times daily.  Dispense: 56 tablet; Refill: 0 - Urinalysis, Routine w reflex microscopic - Urine culture - GC/chlamydia probe amp, urine

## 2014-09-19 NOTE — Patient Instructions (Signed)

## 2014-09-20 LAB — URINALYSIS, ROUTINE W REFLEX MICROSCOPIC
BILIRUBIN URINE: NEGATIVE
Glucose, UA: NEGATIVE mg/dL
Hgb urine dipstick: NEGATIVE
Ketones, ur: NEGATIVE mg/dL
LEUKOCYTES UA: NEGATIVE
NITRITE: NEGATIVE
Protein, ur: NEGATIVE mg/dL
SPECIFIC GRAVITY, URINE: 1.02 (ref 1.005–1.030)
Urobilinogen, UA: 0.2 mg/dL (ref 0.0–1.0)
pH: 5 (ref 5.0–8.0)

## 2014-09-20 LAB — GC/CHLAMYDIA PROBE AMP, URINE
Chlamydia, Swab/Urine, PCR: NEGATIVE
GC Probe Amp, Urine: NEGATIVE

## 2014-09-21 LAB — URINE CULTURE
Colony Count: NO GROWTH
ORGANISM ID, BACTERIA: NO GROWTH

## 2014-12-05 ENCOUNTER — Ambulatory Visit: Payer: Self-pay | Admitting: Internal Medicine

## 2015-03-13 ENCOUNTER — Ambulatory Visit: Payer: Self-pay | Admitting: Internal Medicine

## 2015-09-04 ENCOUNTER — Ambulatory Visit (INDEPENDENT_AMBULATORY_CARE_PROVIDER_SITE_OTHER): Payer: BLUE CROSS/BLUE SHIELD | Admitting: Internal Medicine

## 2015-09-04 ENCOUNTER — Encounter: Payer: Self-pay | Admitting: Internal Medicine

## 2015-09-04 ENCOUNTER — Other Ambulatory Visit: Payer: Self-pay | Admitting: *Deleted

## 2015-09-04 VITALS — BP 130/72 | HR 64 | Temp 97.3°F | Resp 16 | Ht 69.5 in | Wt 218.6 lb

## 2015-09-04 DIAGNOSIS — Z125 Encounter for screening for malignant neoplasm of prostate: Secondary | ICD-10-CM

## 2015-09-04 DIAGNOSIS — Z0000 Encounter for general adult medical examination without abnormal findings: Secondary | ICD-10-CM

## 2015-09-04 DIAGNOSIS — I10 Essential (primary) hypertension: Secondary | ICD-10-CM

## 2015-09-04 DIAGNOSIS — E785 Hyperlipidemia, unspecified: Secondary | ICD-10-CM

## 2015-09-04 DIAGNOSIS — Z111 Encounter for screening for respiratory tuberculosis: Secondary | ICD-10-CM

## 2015-09-04 DIAGNOSIS — R5383 Other fatigue: Secondary | ICD-10-CM

## 2015-09-04 DIAGNOSIS — Z0001 Encounter for general adult medical examination with abnormal findings: Secondary | ICD-10-CM

## 2015-09-04 DIAGNOSIS — Z79899 Other long term (current) drug therapy: Secondary | ICD-10-CM | POA: Diagnosis not present

## 2015-09-04 DIAGNOSIS — E559 Vitamin D deficiency, unspecified: Secondary | ICD-10-CM | POA: Diagnosis not present

## 2015-09-04 DIAGNOSIS — R7303 Prediabetes: Secondary | ICD-10-CM

## 2015-09-04 DIAGNOSIS — Z1212 Encounter for screening for malignant neoplasm of rectum: Secondary | ICD-10-CM

## 2015-09-04 LAB — CBC WITH DIFFERENTIAL/PLATELET
BASOS PCT: 1 % (ref 0–1)
Basophils Absolute: 0.1 10*3/uL (ref 0.0–0.1)
Eosinophils Absolute: 0.3 10*3/uL (ref 0.0–0.7)
Eosinophils Relative: 5 % (ref 0–5)
HCT: 42.7 % (ref 39.0–52.0)
HEMOGLOBIN: 14.8 g/dL (ref 13.0–17.0)
LYMPHS ABS: 2.1 10*3/uL (ref 0.7–4.0)
Lymphocytes Relative: 33 % (ref 12–46)
MCH: 31.5 pg (ref 26.0–34.0)
MCHC: 34.7 g/dL (ref 30.0–36.0)
MCV: 90.9 fL (ref 78.0–100.0)
MONO ABS: 0.5 10*3/uL (ref 0.1–1.0)
MONOS PCT: 8 % (ref 3–12)
MPV: 9.7 fL (ref 8.6–12.4)
NEUTROS ABS: 3.4 10*3/uL (ref 1.7–7.7)
NEUTROS PCT: 53 % (ref 43–77)
Platelets: 228 10*3/uL (ref 150–400)
RBC: 4.7 MIL/uL (ref 4.22–5.81)
RDW: 13 % (ref 11.5–15.5)
WBC: 6.4 10*3/uL (ref 4.0–10.5)

## 2015-09-04 LAB — URINALYSIS, ROUTINE W REFLEX MICROSCOPIC
Bilirubin Urine: NEGATIVE
Glucose, UA: NEGATIVE
Hgb urine dipstick: NEGATIVE
KETONES UR: NEGATIVE
Leukocytes, UA: NEGATIVE
NITRITE: NEGATIVE
PROTEIN: NEGATIVE
Specific Gravity, Urine: 1.013 (ref 1.001–1.035)
pH: 6 (ref 5.0–8.0)

## 2015-09-04 LAB — LIPID PANEL
CHOL/HDL RATIO: 3.6 ratio (ref <=5.0)
CHOLESTEROL: 178 mg/dL (ref 125–200)
HDL: 49 mg/dL (ref >=40)
LDL Cholesterol: 97 mg/dL (ref <130)
Triglycerides: 160 mg/dL — ABNORMAL HIGH (ref <150)
VLDL: 32 mg/dL — ABNORMAL HIGH (ref <30)

## 2015-09-04 LAB — HEPATIC FUNCTION PANEL
ALBUMIN: 4.8 g/dL (ref 3.6–5.1)
ALT: 30 U/L (ref 9–46)
AST: 21 U/L (ref 10–35)
Alkaline Phosphatase: 47 U/L (ref 40–115)
Bilirubin, Direct: 0.2 mg/dL (ref <=0.2)
Indirect Bilirubin: 0.7 mg/dL (ref 0.2–1.2)
TOTAL PROTEIN: 7.2 g/dL (ref 6.1–8.1)
Total Bilirubin: 0.9 mg/dL (ref 0.2–1.2)

## 2015-09-04 LAB — BASIC METABOLIC PANEL WITH GFR
BUN: 13 mg/dL (ref 7–25)
CALCIUM: 9.7 mg/dL (ref 8.6–10.3)
CHLORIDE: 100 mmol/L (ref 98–110)
CO2: 26 mmol/L (ref 20–31)
CREATININE: 0.76 mg/dL (ref 0.70–1.33)
GFR, Est African American: >89 mL/min (ref >=60)
GFR, Est Non African American: >89 mL/min (ref >=60)
GLUCOSE: 95 mg/dL (ref 65–99)
Potassium: 4.3 mmol/L (ref 3.5–5.3)
Sodium: 137 mmol/L (ref 135–146)

## 2015-09-04 LAB — IRON AND TIBC
%SAT: 20 % (ref 15–60)
IRON: 78 ug/dL (ref 50–180)
TIBC: 388 ug/dL (ref 250–425)
UIBC: 310 ug/dL (ref 125–400)

## 2015-09-04 LAB — MAGNESIUM: MAGNESIUM: 2.2 mg/dL (ref 1.5–2.5)

## 2015-09-04 MED ORDER — VITAMIN D (ERGOCALCIFEROL) 1.25 MG (50000 UNIT) PO CAPS: 50000.0000 [IU] | ORAL_CAPSULE | Freq: Every day | ORAL | Status: DC

## 2015-09-04 MED ORDER — LISINOPRIL 20 MG PO TABS: 20.0000 mg | ORAL_TABLET | Freq: Every day | ORAL | Status: DC

## 2015-09-04 MED ORDER — PRAVASTATIN SODIUM 80 MG PO TABS: ORAL_TABLET | ORAL | Status: DC

## 2015-09-04 MED ORDER — ALPRAZOLAM 1 MG PO TABS: 1.0000 mg | ORAL_TABLET | Freq: Three times a day (TID) | ORAL | Status: DC | PRN

## 2015-09-04 MED ORDER — EZETIMIBE 10 MG PO TABS: 10.0000 mg | ORAL_TABLET | Freq: Every day | ORAL | Status: DC

## 2015-09-04 MED ORDER — LABETALOL HCL 200 MG PO TABS: 200.0000 mg | ORAL_TABLET | Freq: Two times a day (BID) | ORAL | Status: DC

## 2015-09-04 NOTE — Patient Instructions (Signed)

## 2015-09-04 NOTE — Progress Notes (Signed)
Patient ID: Zachary Peters, male   DOB: May 02, 1966, 50 y.o.   MRN: [id number]  Annual  Screening/Preventative Visit And Comprehensive Evaluation & Examination     This very nice 50 y.o. MWM  presents for a Preventative Visit & comprehensive evaluation and management of multiple medical co-morbidities.  Patient has been followed for HTN, Prediabetes, Hyperlipidemia and Vitamin D Deficiency. Patient has been lost to recommended f/u over the last year.      HTN predates since 2003. Patient's BP has been controlled at home.Today's BP: 130/72 mmHg. Patient denies any cardiac symptoms as chest pain, palpitations, shortness of breath, dizziness or ankle swelling.     Patient's hyperlipidemia is controlled with diet and medications. Patient denies myalgias or other medication SE's. Last lipids were T Chol 190, HDL 52, Trig 156 and LDL 107 in Feb 2016.      Patient has prediabetes since    and patient denies reactive hypoglycemic symptoms, visual blurring, diabetic polys or paresthesias. Last A1c was 5.2% in Feb 2016.     Finally, patient has history of Vitamin D Deficiency of "20" in 2008 and last vitamin D was still very low at 6131 in Feb 2016.   Medication Sig  . ALPRAZolam (XANAX) 1 MG tablet Take 1 tablet (1 mg total) by mouth 3 (three) times daily as needed for anxiety.  Marland Kitchen. ezetimibe (ZETIA) 10 MG tablet Take 10 mg by mouth daily.  Marland Kitchen. labetalol (NORMODYNE) 200 MG tablet Take 1 tablet (200 mg total) by mouth 2 (two) times daily.  Marland Kitchen. lisinopril (PRINIVIL,ZESTRIL) 20 MG tablet Take 1 tablet (20 mg total) by mouth daily.  . pravastatin 80 MG tablet Take 1 tablet daily for cholesterol  . Vitamin D 50,000 UNITS  Take 1 capsule (50,000 Units total) by mouth daily.   Allergies  Allergen Reactions  . Crestor [Rosuvastatin]     myalgias  . Lipitor [Atorvastatin]     Myalgias   Past Medical History  Diagnosis Date  . Hypertension   . Hyperlipidemia   . Anxiety   . Vitamin D deficiency   . Colitis,  ulcerative   . Hypogonadism male    Health Maintenance  Topic Date Due  . HIV Screening  03/27/1980  . INFLUENZA VACCINE  02/02/2015  . TETANUS/TDAP  07/06/2018  . COLONOSCOPY  05/11/2023   Immunization History  Administered Date(s) Administered  . PPD Test 08/29/2014, 09/04/2015  . Pneumococcal-Unspecified 06/05/2002  . Tdap 07/06/2008   No past surgical history on file.   Family History  Problem Relation Age of Onset  . Cancer Mother     ocular  . Thyroid disease Mother   . Hypertension Father   . Hyperlipidemia Father    Social History   Social History  . Marital Status: Unknown    Spouse Name: N/A  . Number of Children: 1 natural son & 2 adopted sons  . Years of Education: N/A   Occupational History  . 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   Social History Main Topics  . Smoking status: Never Smoker   . Smokeless tobacco: Never Used  . Alcohol Use: 0.0 oz/week    0 Standard drinks or equivalent per week     Comment: occasionally  . Drug Use: No  . Sexual Activity: Yes    ROS Constitutional: Denies fever, chills, weight loss/gain, headaches, insomnia,  night sweats or change in appetite. Does c/o fatigue. Eyes: Denies redness, blurred vision, diplopia, discharge, itchy or watery eyes.  ENT: Denies discharge, congestion, post nasal drip, epistaxis, sore throat, earache, hearing loss, dental pain, Tinnitus, Vertigo, Sinus pain or snoring.  Cardio: Denies chest pain, palpitations, irregular heartbeat, syncope, dyspnea, diaphoresis, orthopnea, PND, claudication or edema Respiratory: denies cough, dyspnea, DOE, pleurisy, hoarseness, laryngitis or wheezing.  Gastrointestinal: Denies dysphagia, heartburn, reflux, water brash, pain, cramps, nausea, vomiting, bloating, diarrhea, constipation, hematemesis, melena, hematochezia, jaundice or hemorrhoids Genitourinary: Denies dysuria, frequency, urgency, nocturia, hesitancy,  discharge, hematuria or flank pain Musculoskeletal: Denies arthralgia, myalgia, stiffness, Jt. Swelling, pain, limp or strain/sprain. Denies Falls. Skin: Denies puritis, rash, hives, warts, acne, eczema or change in skin lesion Neuro: No weakness, tremor, incoordination, spasms, paresthesia or pain Psychiatric: Denies confusion, memory loss or sensory loss. Denies Depression. Endocrine: Denies change in weight, skin, hair change, nocturia, and paresthesia, diabetic polys, visual blurring or hyper / hypo glycemic episodes.  Heme/Lymph: No excessive bleeding, bruising or enlarged lymph nodes.  Physical Exam  BP 130/72 mmHg  Pulse 64  Temp(Src) 97.3 F (36.3 C)  Resp 16  Ht 5' 9.5" (1.765 m)  Wt 218 lb 9.6 oz (99.156 kg)  BMI 31.83 kg/m2  General Appearance: Well nourished, in no apparent distress . Eyes: PERRLA, EOMs, conjunctiva no swelling or erythema, normal fundi and vessels. Sinuses: No frontal/maxillary tenderness ENT/Mouth: EACs patent / TMs  nl. Nares clear without erythema, swelling, mucoid exudates. Oral hygiene is good. No erythema, swelling, or exudate. Tongue normal, non-obstructing. Tonsils not swollen or erythematous. Hearing normal.  Neck: Supple, thyroid normal. No bruits, nodes or JVD. Respiratory: Respiratory effort normal.  BS equal and clear bilateral without rales, rhonci, wheezing or stridor. Cardio: Heart sounds are normal with regular rate and rhythm and no murmurs, rubs or gallops. Peripheral pulses are normal and equal bilaterally without edema. No aortic or femoral bruits. Chest: symmetric with normal excursions and percussion.  Abdomen: Soft, with Nl bowel sounds. Nontender, no guarding, rebound, hernias, masses, or organomegaly.  Lymphatics: Non tender without lymphadenopathy.  Genitourinary: No hernias.Testes nl. DRE - prostate nl for age - smooth & firm w/o nodules. Musculoskeletal: Full ROM all peripheral extremities, joint stability, 5/5 strength, and  normal gait. Skin: Warm and dry without rashes, lesions, cyanosis, clubbing or  ecchymosis.  Neuro: Cranial nerves intact, reflexes equal bilaterally. Normal muscle tone, no cerebellar symptoms. Sensation intact.  Pysch: Alert and oriented X 3 with normal affect, insight and judgment appropriate.   Assessment and Plan  1. Annual Preventative/Screening Exam    - Microalbumin / creatinine urine ratio - EKG 12-Lead - [place], RETROPERITNL ABD,  LTD - POC Hemoccult Bld/Stl  - Urinalysis, Routine w reflex microscopic - Vitamin B12 - Iron and TIBC - PSA - Testosterone - CBC with Differential/Platelet - BASIC METABOLIC PANEL WITH GFR - Hepatic function panel - Magnesium - Lipid panel - TSH - Hemoglobin A1c - Insulin, random - VITAMIN D 25 Hydroxy   2. Essential hypertension  - Microalbumin / creatinine urine ratio - EKG 12-Lead - [place], RETROPERITNL ABD,  LTD - TSH  3. Hyperlipidemia  - Lipid panel - TSH  4. Prediabetes  - Hemoglobin A1c - Insulin, random  5. Vitamin D deficiency  - VITAMIN D 25 Hydroxy   6. Screening for rectal cancer  - POC Hemoccult Bld/Stl   7. Prostate cancer screening  - PSA  8. Other fatigue  - Vitamin B12 - Iron and TIBC - Testosterone - CBC with Differential/Platelet - TSH  9. Medication management  - Urinalysis, Routine w reflex microscopic  - CBC  with Differential/Platelet - BASIC METABOLIC PANEL WITH GFR - Hepatic function panel - Magnesium  10. Screening examination for pulmonary tuberculosis  - PPD   Continue prudent diet as discussed, weight control, BP monitoring, regular exercise, and medications as discussed.  Discussed med effects and SE's. Routine screening labs and tests as requested with regular follow-up as recommended. Over 40 minutes of exam, counseling, chart review and high complex critical decision making was performed

## 2015-09-05 LAB — VITAMIN B12: Vitamin B-12: 490 pg/mL (ref 200–1100)

## 2015-09-05 LAB — TESTOSTERONE: TESTOSTERONE: 198 ng/dL — AB (ref 250–827)

## 2015-09-05 LAB — VITAMIN D 25 HYDROXY (VIT D DEFICIENCY, FRACTURES): VIT D 25 HYDROXY: 23 ng/mL — AB (ref 30–100)

## 2015-09-05 LAB — MICROALBUMIN / CREATININE URINE RATIO
CREATININE, URINE: 105 mg/dL (ref 20–370)
Microalb, Ur: <0.2 mg/dL

## 2015-09-05 LAB — HEMOGLOBIN A1C
HEMOGLOBIN A1C: 5.3 % (ref <5.7)
MEAN PLASMA GLUCOSE: 105 mg/dL (ref <117)

## 2015-09-05 LAB — PSA: PSA: 0.3 ng/mL (ref <=4.00)

## 2015-09-05 LAB — TSH: TSH: 1.49 m[IU]/L (ref 0.40–4.50)

## 2015-09-06 ENCOUNTER — Encounter: Payer: Self-pay | Admitting: Internal Medicine

## 2015-09-07 LAB — TB SKIN TEST
INDURATION: 0 mm
TB SKIN TEST: NEGATIVE

## 2015-09-07 LAB — INSULIN, RANDOM: INSULIN: 5.5 u[IU]/mL (ref 2.0–19.6)

## 2015-09-16 ENCOUNTER — Encounter: Payer: Self-pay | Admitting: Internal Medicine

## 2015-09-18 ENCOUNTER — Encounter: Payer: Self-pay | Admitting: Internal Medicine

## 2016-10-07 ENCOUNTER — Ambulatory Visit (INDEPENDENT_AMBULATORY_CARE_PROVIDER_SITE_OTHER): Payer: BLUE CROSS/BLUE SHIELD | Admitting: Internal Medicine

## 2016-10-07 VITALS — BP 132/84 | HR 60 | Temp 97.2°F | Wt 219.6 lb

## 2016-10-07 DIAGNOSIS — E559 Vitamin D deficiency, unspecified: Secondary | ICD-10-CM

## 2016-10-07 DIAGNOSIS — Z0000 Encounter for general adult medical examination without abnormal findings: Secondary | ICD-10-CM

## 2016-10-07 DIAGNOSIS — Z0001 Encounter for general adult medical examination with abnormal findings: Secondary | ICD-10-CM

## 2016-10-07 DIAGNOSIS — R7303 Prediabetes: Secondary | ICD-10-CM

## 2016-10-07 DIAGNOSIS — I10 Essential (primary) hypertension: Secondary | ICD-10-CM | POA: Diagnosis not present

## 2016-10-07 DIAGNOSIS — Z79899 Other long term (current) drug therapy: Secondary | ICD-10-CM

## 2016-10-07 DIAGNOSIS — Z111 Encounter for screening for respiratory tuberculosis: Secondary | ICD-10-CM | POA: Diagnosis not present

## 2016-10-07 DIAGNOSIS — Z1212 Encounter for screening for malignant neoplasm of rectum: Secondary | ICD-10-CM

## 2016-10-07 DIAGNOSIS — Z136 Encounter for screening for cardiovascular disorders: Secondary | ICD-10-CM

## 2016-10-07 DIAGNOSIS — R5383 Other fatigue: Secondary | ICD-10-CM

## 2016-10-07 DIAGNOSIS — Z125 Encounter for screening for malignant neoplasm of prostate: Secondary | ICD-10-CM

## 2016-10-07 DIAGNOSIS — E782 Mixed hyperlipidemia: Secondary | ICD-10-CM

## 2016-10-07 LAB — BASIC METABOLIC PANEL WITH GFR
BUN: 11 mg/dL (ref 7–25)
CALCIUM: 9.7 mg/dL (ref 8.6–10.3)
CO2: 28 mmol/L (ref 20–31)
Chloride: 99 mmol/L (ref 98–110)
Creat: 0.8 mg/dL (ref 0.70–1.33)
GFR, Est African American: >89 mL/min (ref >=60)
GLUCOSE: 86 mg/dL (ref 65–99)
Potassium: 4.2 mmol/L (ref 3.5–5.3)
SODIUM: 137 mmol/L (ref 135–146)

## 2016-10-07 LAB — LIPID PANEL
Cholesterol: 254 mg/dL — ABNORMAL HIGH (ref <200)
HDL: 54 mg/dL (ref >40)
LDL CALC: 148 mg/dL — AB (ref <100)
TRIGLYCERIDES: 258 mg/dL — AB (ref <150)
Total CHOL/HDL Ratio: 4.7 Ratio (ref <5.0)
VLDL: 52 mg/dL — ABNORMAL HIGH (ref <30)

## 2016-10-07 LAB — IRON AND TIBC
%SAT: 18 % (ref 15–60)
Iron: 73 ug/dL (ref 50–180)
TIBC: 400 ug/dL (ref 250–425)
UIBC: 327 ug/dL (ref 125–400)

## 2016-10-07 LAB — CBC WITH DIFFERENTIAL/PLATELET
Basophils Absolute: 68 cells/uL (ref 0–200)
Basophils Relative: 1 %
EOS PCT: 5 %
Eosinophils Absolute: 340 cells/uL (ref 15–500)
HCT: 45.5 % (ref 38.5–50.0)
Hemoglobin: 15.8 g/dL (ref 13.2–17.1)
LYMPHS PCT: 29 %
Lymphs Abs: 1972 cells/uL (ref 850–3900)
MCH: 31.8 pg (ref 27.0–33.0)
MCHC: 34.7 g/dL (ref 32.0–36.0)
MCV: 91.5 fL (ref 80.0–100.0)
MONOS PCT: 7 %
MPV: 9.4 fL (ref 7.5–12.5)
Monocytes Absolute: 476 cells/uL (ref 200–950)
NEUTROS PCT: 58 %
Neutro Abs: 3944 cells/uL (ref 1500–7800)
PLATELETS: 249 10*3/uL (ref 140–400)
RBC: 4.97 MIL/uL (ref 4.20–5.80)
RDW: 13.3 % (ref 11.0–15.0)
WBC: 6.8 10*3/uL (ref 3.8–10.8)

## 2016-10-07 LAB — HEPATIC FUNCTION PANEL
ALT: 25 U/L (ref 9–46)
AST: 18 U/L (ref 10–35)
Albumin: 5 g/dL (ref 3.6–5.1)
Alkaline Phosphatase: 49 U/L (ref 40–115)
BILIRUBIN INDIRECT: 0.6 mg/dL (ref 0.2–1.2)
Bilirubin, Direct: 0.1 mg/dL (ref <=0.2)
TOTAL PROTEIN: 7.7 g/dL (ref 6.1–8.1)
Total Bilirubin: 0.7 mg/dL (ref 0.2–1.2)

## 2016-10-07 LAB — TSH: TSH: 1.21 mIU/L (ref 0.40–4.50)

## 2016-10-07 MED ORDER — ATENOLOL 100 MG PO TABS: 100.0000 mg | ORAL_TABLET | Freq: Every day | ORAL | 1 refills | Status: DC

## 2016-10-07 MED ORDER — ALPRAZOLAM 1 MG PO TABS: 1.0000 mg | ORAL_TABLET | Freq: Three times a day (TID) | ORAL | 5 refills | Status: DC | PRN

## 2016-10-07 MED ORDER — ROSUVASTATIN CALCIUM 40 MG PO TABS: ORAL_TABLET | ORAL | 0 refills | Status: DC

## 2016-10-07 MED ORDER — EZETIMIBE 10 MG PO TABS: 10.0000 mg | ORAL_TABLET | Freq: Every day | ORAL | 1 refills | Status: DC

## 2016-10-07 NOTE — Progress Notes (Signed)
[facility]   Lucky Cowboy, M.D.    Dyanne Carrel. Steffanie Dunn, P.A.-C      Terri Piedra, P.A.-C  [facility]                [address]                [place], [place]. [address] Telephone [phone] Telefax [phone] Annual  Screening/Preventative Visit  & Comprehensive Evaluation & Examination     This very nice 51 y.o. DWM presents for a Screening/Preventative Visit & comprehensive evaluation and management of multiple medical co-morbidities.  Patient has been followed for HTN, T2_NIDDM  Prediabetes, Hyperlipidemia and Vitamin D Deficiency.     HTN predates since 2003. Patient's BP has been controlled at home.  Today's BP is borderline elevated at 130/92 and rechecked at 132/84. Patient denies any cardiac symptoms as chest pain, palpitations, shortness of breath, dizziness or ankle swelling.     Patient has hx/o Statin Intolerance with muscular aching and hyperlipidemia is not controlled with diet and medications. Current lipids are not at goal: Lab Results  Component Value Date   CHOL 254 (H) 10/07/2016   HDL 54 10/07/2016   LDLCALC 148 (H) 10/07/2016   TRIG 258 (H) 10/07/2016   CHOLHDL 4.7 10/07/2016      Patient is screened proactively for prediabetes and patient denies reactive hypoglycemic symptoms, visual blurring, diabetic polys or paresthesias. Current A1c is  at goal: Lab Results  Component Value Date   HGBA1C 4.9 10/07/2016       Finally, patient has history of Vitamin D Deficiency ("20" in 2008) and current  vitamin D was still very low: Lab Results  Component Value Date   VD25OH 14 (L) 10/07/2016   Current Outpatient Prescriptions on File Prior to Visit  Medication Sig  . lisinopril 20 MG tablet Take 1 tab daily.  . Vitamin D 50,000 units   Take 1 cap daily - takes sporadically    No Known Allergies   Past Medical History:  Diagnosis Date  . Anxiety   . Colitis, ulcerative (HCC)   .  Hyperlipidemia   . Hypertension   . Hypogonadism male   . Vitamin D deficiency    Health Maintenance  Topic Date Due  . HIV Screening  03/27/1980  . INFLUENZA VACCINE  02/01/2017  . TETANUS/TDAP  07/06/2018  . COLONOSCOPY  05/11/2023   Immunization History  Administered Date(s) Administered  . PPD Test 08/29/2014, 09/04/2015, 10/07/2016  . Pneumococcal-Unspecified 06/05/2002  . Tdap 07/06/2008   No past surgical history on file. Family History  Problem Relation Age of Onset  . Cancer Mother     ocular  . Thyroid disease Mother   . Hypertension Father   . Hyperlipidemia Father    Social History   Social History  . Marital status: Unknown    Spouse name: N/A  . Number of children: N/A  . 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   Social History Main Topics  . Smoking status: Never Smoker  . Smokeless tobacco: Never Used  . Alcohol use 0.0 oz/week     Comment: occasionally  . Drug use: No  . Sexual activity: Yes    ROS Constitutional: Denies fever, chills, weight loss/gain, headaches, insomnia,  night sweats or change in appetite. Does c/o fatigue. Eyes: Denies redness, blurred vision, diplopia, discharge, itchy or  watery eyes.  ENT: Denies discharge, congestion, post nasal drip, epistaxis, sore throat, earache, hearing loss, dental pain, Tinnitus, Vertigo, Sinus pain or snoring.  Cardio: Denies chest pain, palpitations, irregular heartbeat, syncope, dyspnea, diaphoresis, orthopnea, PND, claudication or edema Respiratory: denies cough, dyspnea, DOE, pleurisy, hoarseness, laryngitis or wheezing.  Gastrointestinal: Denies dysphagia, heartburn, reflux, water brash, pain, cramps, nausea, vomiting, bloating, diarrhea, constipation, hematemesis, melena, hematochezia, jaundice or hemorrhoids Genitourinary: Denies dysuria, frequency, urgency, nocturia, hesitancy, discharge, hematuria or  flank pain Musculoskeletal: Denies arthralgia, myalgia, stiffness, Jt. Swelling, pain, limp or strain/sprain. Denies Falls. Skin: Denies puritis, rash, hives, warts, acne, eczema or change in skin lesion Neuro: No weakness, tremor, incoordination, spasms, paresthesia or pain Psychiatric: Denies confusion, memory loss or sensory loss. Denies Depression. Endocrine: Denies change in weight, skin, hair change, nocturia, and paresthesia, diabetic polys, visual blurring or hyper / hypo glycemic episodes.  Heme/Lymph: No excessive bleeding, bruising or enlarged lymph nodes.  Physical Exam  BP 132/84   Pulse 60   Temp 97.2 F (36.2 C)   Wt 219 lb 9.6 oz (99.6 kg)   BMI 31.96 kg/m   General Appearance: Well nourished and well groomed and in no apparent distress.  Eyes: PERRLA, EOMs, conjunctiva no swelling or erythema, normal fundi and vessels. Sinuses: No frontal/maxillary tenderness ENT/Mouth: EACs patent / TMs  nl. Nares clear without erythema, swelling, mucoid exudates. Oral hygiene is good. No erythema, swelling, or exudate. Tongue normal, non-obstructing. Tonsils not swollen or erythematous. Hearing normal.  Neck: Supple, thyroid normal. No bruits, nodes or JVD. Respiratory: Respiratory effort normal.  BS equal and clear bilateral without rales, rhonci, wheezing or stridor. Cardio: Heart sounds are normal with regular rate and rhythm and no murmurs, rubs or gallops. Peripheral pulses are normal and equal bilaterally without edema. No aortic or femoral bruits. Chest: symmetric with normal excursions and percussion.  Abdomen: Soft, with Nl bowel sounds. Nontender, no guarding, rebound, hernias, masses, or organomegaly.  Lymphatics: Non tender without lymphadenopathy.  Genitourinary: No hernias.Testes nl. DRE - prostate nl for age - smooth & firm w/o nodules. Musculoskeletal: Full ROM all peripheral extremities, joint stability, 5/5 strength, and normal gait. Skin: Warm and dry without  rashes, lesions, cyanosis, clubbing or  ecchymosis.  Neuro: Cranial nerves intact, reflexes equal bilaterally. Normal muscle tone, no cerebellar symptoms. Sensation intact.  Pysch: Alert and oriented X 3 with normal affect, insight and judgment appropriate.   Assessment and Plan  1. Annual Preventative/Screening Exam   2. Essential hypertension  - EKG 12-Lead - [place], RETROPERITNL ABD,  LTD - Urinalysis, Routine w reflex microscopic - Microalbumin / creatinine urine ratio - CBC with Differential/Platelet - BASIC METABOLIC PANEL WITH GFR - TSH  3. Mixed hyperlipidemia  - EKG 12-Lead - [place], RETROPERITNL ABD,  LTD - Hepatic function panel - Lipid panel - TSH - ezetimibe10 MG tablet; Take 1 tab daily.  Disp: 90 tablet; Refill: 1 - rosuvastatin  40 MG tablet; Take 1/2 to 1 tab daily or as directed for Cholesterol  Dispense: 30 tablet; Refill: 0 Retry on low dose Statin 2-3 x/week  4. Prediabetes  - EKG 12-Lead - [place], RETROPERITNL ABD,  LTD - Hemoglobin A1c - Insulin, random  5. Vitamin D deficiency  - VITAMIN D 25 Hydroxy  6. Screening for rectal cancer  - POC Hemoccult Bld/Stl  7. Prostate cancer screening  - PSA  8. Screening examination for pulmonary tuberculosis  - PPD  9. Screening for ischemic heart disease  - EKG 12-Lead  10.  Screening for AAA (aortic abdominal aneurysm)  - [place], RETROPERITNL ABD,  LTD  11. Fatigue, unspecified type  - Vitamin B12 - Iron and TIBC - Testosterone - CBC with Differential/Platelet  12. Medication management  - Urinalysis, Routine w reflex microscopic - CBC with Differential/Platelet - BASIC METABOLIC PANEL WITH GFR - Hepatic function panel - Lipid panel - TSH - Hemoglobin A1c - Insulin, random - VITAMIN D 25 Hydroxyl       Patient was counseled in prudent diet, weight contro to achieve/maintain BMI less than 25, BP monitoring, regular exercise and medications as discussed.  Discussed med effects and SE's. Routine  screening labs and tests as requested with regular follow-up as recommended. Over 40 minutes of exam, counseling, chart review and high complex critical decision making was performed

## 2016-10-07 NOTE — Patient Instructions (Signed)

## 2016-10-08 ENCOUNTER — Other Ambulatory Visit: Payer: Self-pay | Admitting: Internal Medicine

## 2016-10-08 ENCOUNTER — Encounter: Payer: Self-pay | Admitting: Internal Medicine

## 2016-10-08 DIAGNOSIS — E559 Vitamin D deficiency, unspecified: Secondary | ICD-10-CM

## 2016-10-08 LAB — VITAMIN B12: Vitamin B-12: 454 pg/mL (ref 200–1100)

## 2016-10-08 LAB — URINALYSIS, MICROSCOPIC ONLY
Bacteria, UA: NONE SEEN [HPF]
CASTS: NONE SEEN [LPF]
Crystals: NONE SEEN [HPF]
RBC / HPF: NONE SEEN RBC/HPF (ref <=2)
Squamous Epithelial / LPF: NONE SEEN [HPF] (ref <=5)
WBC, UA: NONE SEEN WBC/HPF (ref <=5)
YEAST: NONE SEEN [HPF]

## 2016-10-08 LAB — URINALYSIS, ROUTINE W REFLEX MICROSCOPIC
BILIRUBIN URINE: NEGATIVE
GLUCOSE, UA: NEGATIVE
HGB URINE DIPSTICK: NEGATIVE
Ketones, ur: NEGATIVE
LEUKOCYTES UA: NEGATIVE
Nitrite: NEGATIVE
PROTEIN: NEGATIVE
Specific Gravity, Urine: 1.019 (ref 1.001–1.035)
pH: 5.5 (ref 5.0–8.0)

## 2016-10-08 LAB — MICROALBUMIN / CREATININE URINE RATIO
CREATININE, URINE: 243 mg/dL (ref 20–370)
Microalb Creat Ratio: 7 mcg/mg creat (ref <30)
Microalb, Ur: 1.6 mg/dL

## 2016-10-08 LAB — TESTOSTERONE: TESTOSTERONE: 208 ng/dL — AB (ref 250–827)

## 2016-10-08 LAB — PSA: PSA: 0.4 ng/mL (ref <=4.0)

## 2016-10-08 LAB — HEMOGLOBIN A1C
HEMOGLOBIN A1C: 4.9 % (ref <5.7)
MEAN PLASMA GLUCOSE: 94 mg/dL

## 2016-10-08 LAB — VITAMIN D 25 HYDROXY (VIT D DEFICIENCY, FRACTURES): Vit D, 25-Hydroxy: 14 ng/mL — ABNORMAL LOW (ref 30–100)

## 2016-10-10 LAB — INSULIN, RANDOM: INSULIN: 7.1 u[IU]/mL (ref 2.0–19.6)

## 2016-11-27 ENCOUNTER — Other Ambulatory Visit: Payer: Self-pay | Admitting: Internal Medicine

## 2016-11-27 DIAGNOSIS — E782 Mixed hyperlipidemia: Secondary | ICD-10-CM

## 2017-01-13 ENCOUNTER — Ambulatory Visit: Payer: Self-pay | Admitting: Physician Assistant

## 2017-02-02 NOTE — Progress Notes (Signed)
Assessment and Plan:   Hypertension -Continue medication, monitor blood pressure at home. Continue DASH diet.  Reminder to go to the ER if any CP, SOB, nausea, dizziness, severe HA, changes vision/speech, left arm numbness and tingling and jaw pain.  Cholesterol -Continue diet and exercise. Check cholesterol.    Prediabetes  -Continue diet and exercise. Check A1C  Vitamin D Def - check level and continue medications.   Continue diet and meds as discussed. Further disposition pending results of labs. Over 30 minutes of exam, counseling, chart review, and critical decision making was performed  Future Appointments Date Time Provider Department Center  04/28/2017 10:30 AM Lucky CowboyMcKeown, William, MD [facility] None  11/03/2017 10:00 AM Lucky CowboyMcKeown, William, MD [facility] None     HPI 51 y.o. male  presents for 3 month follow up on hypertension, cholesterol, prediabetes, and vitamin D deficiency.   His blood pressure has been controlled at home, today their BP is BP: 130/82  He does not workout, walks at work. He denies chest pain, shortness of breath, dizziness.  He is on cholesterol medication, crestor 40 1/2 daily and on zetia and denies myalgias. His cholesterol is not at goal. The cholesterol last visit was:   Lab Results  Component Value Date   CHOL 254 (H) 10/07/2016   HDL 54 10/07/2016   LDLCALC 148 (H) 10/07/2016   TRIG 258 (H) 10/07/2016   CHOLHDL 4.7 10/07/2016   Last Z6XA1C in the office was:  Lab Results  Component Value Date   HGBA1C 4.9 10/07/2016   Patient is on Vitamin D supplement, he is on 50,000 IU every other day.   Lab Results  Component Value Date   VD25OH 14 (L) 10/07/2016     BMI is Body mass index is 32.02 kg/m., he is working on diet and exercise. Wt Readings from Last 3 Encounters:  02/03/17 220 lb (99.8 kg)  10/07/16 219 lb 9.6 oz (99.6 kg)  09/04/15 218 lb 9.6 oz (99.2 kg)     Current Medications:  Current Outpatient Prescriptions on File Prior  to Visit  Medication Sig  . ALPRAZolam (XANAX) 1 MG tablet Take 1 tablet (1 mg total) by mouth 3 (three) times daily as needed for anxiety.  Marland Kitchen. atenolol (TENORMIN) 100 MG tablet Take 1 tablet (100 mg total) by mouth daily.  Marland Kitchen. ezetimibe (ZETIA) 10 MG tablet Take 1 tablet (10 mg total) by mouth daily.  Marland Kitchen. lisinopril (PRINIVIL,ZESTRIL) 20 MG tablet Take 1 tablet (20 mg total) by mouth daily.  . rosuvastatin (CRESTOR) 40 MG tablet TAKE 1/2 TO 1 TABLET BY MOUTH DAILY OR AS DIRECTED FOR CHOLESTEROL  . Vitamin D, Ergocalciferol, (DRISDOL) 50000 units CAPS capsule Take 1 capsule (50,000 Units total) by mouth daily.   No current facility-administered medications on file prior to visit.     Medical History:  Past Medical History:  Diagnosis Date  . Anxiety   . Colitis, ulcerative (HCC)   . Hyperlipidemia   . Hypertension   . Hypogonadism male   . Vitamin D deficiency    Allergies: No Known Allergies   Review of Systems:  Review of Systems  Constitutional: Negative.   HENT: Negative.   Eyes: Negative.   Respiratory: Negative.   Cardiovascular: Negative.   Gastrointestinal: Negative.   Genitourinary: Negative.   Musculoskeletal: Negative.   Skin: Negative.   Neurological: Negative.   Endo/Heme/Allergies: Negative.   Psychiatric/Behavioral: Negative.     Family history- Review and unchanged Social history- Review and unchanged Physical  Exam: BP 130/82   Pulse 64   Temp (!) 97.3 F (36.3 C)   Resp 14   Ht 5' 9.5" (1.765 m)   Wt 220 lb (99.8 kg)   SpO2 96%   BMI 32.02 kg/m  Wt Readings from Last 3 Encounters:  02/03/17 220 lb (99.8 kg)  10/07/16 219 lb 9.6 oz (99.6 kg)  09/04/15 218 lb 9.6 oz (99.2 kg)   General Appearance: Well nourished, in no apparent distress. Eyes: PERRLA, EOMs, conjunctiva no swelling or erythema Sinuses: No Frontal/maxillary tenderness ENT/Mouth: Ext aud canals clear, TMs without erythema, bulging. No erythema, swelling, or exudate on post pharynx.   Tonsils not swollen or erythematous. Hearing normal.  Neck: Supple, thyroid normal.  Respiratory: Respiratory effort normal, BS equal bilaterally without rales, rhonchi, wheezing or stridor.  Cardio: RRR with no MRGs. Brisk peripheral pulses without edema.  Abdomen: Soft, + BS,  Non tender, no guarding, rebound, hernias, masses. Lymphatics: Non tender without lymphadenopathy.  Musculoskeletal: Full ROM, 5/5 strength, Normal gait Skin: Warm, dry without rashes, lesions, ecchymosis.  Neuro: Cranial nerves intact. Normal muscle tone, no cerebellar symptoms. Psych: Awake and oriented X 3, normal affect, Insight and Judgment appropriate.    Quentin MullingAmanda Michole Lecuyer, PA-C 10:37 AM [facility]

## 2017-02-03 ENCOUNTER — Ambulatory Visit (INDEPENDENT_AMBULATORY_CARE_PROVIDER_SITE_OTHER): Payer: BLUE CROSS/BLUE SHIELD | Admitting: Physician Assistant

## 2017-02-03 ENCOUNTER — Encounter: Payer: Self-pay | Admitting: Physician Assistant

## 2017-02-03 VITALS — BP 130/82 | HR 64 | Temp 97.3°F | Resp 14 | Ht 69.5 in | Wt 220.0 lb

## 2017-02-03 DIAGNOSIS — Z79899 Other long term (current) drug therapy: Secondary | ICD-10-CM

## 2017-02-03 DIAGNOSIS — E785 Hyperlipidemia, unspecified: Secondary | ICD-10-CM

## 2017-02-03 DIAGNOSIS — E559 Vitamin D deficiency, unspecified: Secondary | ICD-10-CM | POA: Diagnosis not present

## 2017-02-03 DIAGNOSIS — E782 Mixed hyperlipidemia: Secondary | ICD-10-CM | POA: Diagnosis not present

## 2017-02-03 DIAGNOSIS — I10 Essential (primary) hypertension: Secondary | ICD-10-CM | POA: Diagnosis not present

## 2017-02-03 MED ORDER — ROSUVASTATIN CALCIUM 20 MG PO TABS: ORAL_TABLET | ORAL | 1 refills | Status: DC

## 2017-02-03 MED ORDER — EZETIMIBE 10 MG PO TABS: 10.0000 mg | ORAL_TABLET | Freq: Every day | ORAL | 1 refills | Status: DC

## 2017-02-03 NOTE — Patient Instructions (Signed)
Monitor your blood pressure at home. Go to the ER if any CP, SOB, nausea, dizziness, severe HA, changes vision/speech  Goal BP:  For patients younger than 60: Goal BP < 140/90. For patients 60 and older: Goal BP < 150/90. For patients with diabetes: Goal BP < 140/90. Your most recent BP: BP: 130/82   Take your medications faithfully as instructed. Maintain a healthy weight. Get at least 150 minutes of aerobic exercise per week. Minimize salt intake. Minimize alcohol intake  DASH Eating Plan DASH stands for "Dietary Approaches to Stop Hypertension." The DASH eating plan is a healthy eating plan that has been shown to reduce high blood pressure (hypertension). Additional health benefits may include reducing the risk of type 2 diabetes mellitus, heart disease, and stroke. The DASH eating plan may also help with weight loss. WHAT DO I NEED TO KNOW ABOUT THE DASH EATING PLAN? For the DASH eating plan, you will follow these general guidelines:  Choose foods with a percent daily value for sodium of less than 5% (as listed on the food label).  Use salt-free seasonings or herbs instead of table salt or sea salt.  Check with your health care provider or pharmacist before using salt substitutes.  Eat lower-sodium products, often labeled as "lower sodium" or "no salt added."  Eat fresh foods.  Eat more vegetables, fruits, and low-fat dairy products.  Choose whole grains. Look for the word "whole" as the first word in the ingredient list.  Choose fish and skinless chicken or [place] more often than red meat. Limit fish, poultry, and meat to 6 oz (170 g) each day.  Limit sweets, desserts, sugars, and sugary drinks.  Choose heart-healthy fats.  Limit cheese to 1 oz (28 g) per day.  Eat more home-cooked food and less restaurant, buffet, and fast food.  Limit fried foods.  Cook foods using methods other than frying.  Limit canned vegetables. If you do use them, rinse them well to  decrease the sodium.  When eating at a restaurant, ask that your food be prepared with less salt, or no salt if possible. WHAT FOODS CAN I EAT? Seek help from a dietitian for individual calorie needs. Grains Whole grain or whole wheat bread. Brown rice. Whole grain or whole wheat pasta. Quinoa, bulgur, and whole grain cereals. Low-sodium cereals. Corn or whole wheat flour tortillas. Whole grain cornbread. Whole grain crackers. Low-sodium crackers. Vegetables Fresh or frozen vegetables (raw, steamed, roasted, or grilled). Low-sodium or reduced-sodium tomato and vegetable juices. Low-sodium or reduced-sodium tomato sauce and paste. Low-sodium or reduced-sodium canned vegetables.  Fruits All fresh, canned (in natural juice), or frozen fruits. Meat and Other Protein Products Ground beef (85% or leaner), grass-fed beef, or beef trimmed of fat. Skinless chicken or [place]. Ground chicken or [place]. Pork trimmed of fat. All fish and seafood. Eggs. Dried beans, peas, or lentils. Unsalted nuts and seeds. Unsalted canned beans. Dairy Low-fat dairy products, such as skim or 1% milk, 2% or reduced-fat cheeses, low-fat ricotta or cottage cheese, or plain low-fat yogurt. Low-sodium or reduced-sodium cheeses. Fats and Oils Tub margarines without trans fats. Light or reduced-fat mayonnaise and salad dressings (reduced sodium). Avocado. Safflower, olive, or canola oils. Natural peanut or almond butter. Other Unsalted popcorn and pretzels. The items listed above may not be a complete list of recommended foods or beverages. Contact your dietitian for more options. WHAT FOODS ARE NOT RECOMMENDED? Grains White bread. White pasta. White rice. Refined cornbread. Bagels and croissants. Crackers that contain trans  fat. Vegetables Creamed or fried vegetables. Vegetables in a cheese sauce. Regular canned vegetables. Regular canned tomato sauce and paste. Regular tomato and vegetable juices. Fruits Dried fruits. Canned  fruit in light or heavy syrup. Fruit juice. Meat and Other Protein Products Fatty cuts of meat. Ribs, chicken wings, bacon, sausage, bologna, salami, chitterlings, fatback, hot dogs, bratwurst, and packaged luncheon meats. Salted nuts and seeds. Canned beans with salt. Dairy Whole or 2% milk, cream, half-and-half, and cream cheese. Whole-fat or sweetened yogurt. Full-fat cheeses or blue cheese. Nondairy creamers and whipped toppings. Processed cheese, cheese spreads, or cheese curds. Condiments Onion and garlic salt, seasoned salt, table salt, and sea salt. Canned and packaged gravies. Worcestershire sauce. Tartar sauce. Barbecue sauce. Teriyaki sauce. Soy sauce, including reduced sodium. Steak sauce. Fish sauce. Oyster sauce. Cocktail sauce. Horseradish. Ketchup and mustard. Meat flavorings and tenderizers. Bouillon cubes. Hot sauce. Tabasco sauce. Marinades. Taco seasonings. Relishes. Fats and Oils Butter, stick margarine, lard, shortening, ghee, and bacon fat. Coconut, palm kernel, or palm oils. Regular salad dressings. Other Pickles and olives. Salted popcorn and pretzels. The items listed above may not be a complete list of foods and beverages to avoid. Contact your dietitian for more information. WHERE CAN I FIND MORE INFORMATION? National Heart, Lung, and Blood Institute: CableProm[employer].itwww.nhlbi.nih.gov/health/health-topics/topics/dash/ Document Released: 06/09/2011 Document Revised: 11/04/2013 Document Reviewed: 04/24/2013 [facility] Patient Information 2015 [employer] [place]. This information is not intended to replace advice given to you by your health care provider. Make sure you discuss any questions you have with your health care provider.     Bad carbs also include fruit juice, alcohol, and sweet tea. These are empty calories that do not signal to your brain that you are full.   Please remember the good carbs are still carbs which convert into sugar. So please measure them out no more than 1/2-1  cup of rice, oatmeal, pasta, and beans  Veggies are however free foods! Pile them on.   Not all fruit is created equal. Please see the list below, the fruit at the bottom is higher in sugars than the fruit at the top. Please avoid all dried fruits.      Simple math prevails.    1st - exercise does not produce significant weight loss - at best one converts fat into muscle , "bulks up", loses inches, but usually stays "weight neutral"     2nd - think of your body weightas a check book: If you eat more calories than you burn up - you save money or gain weight .... Or if you spend more money than you put in the check book, ie burn up more calories than you eat, then you lose weight     3rd - if you walk or run 1 mile, you burn up 100 calories - you have to burn up 3,500 calories to lose 1 pound, ie you have to walk/run 35 miles to lose 1 measly pound. So if you want to lose 10 #, then you have to walk/run 350 miles, so.... clearly exercise is not the solution.     4. So if you consume 1,500 calories, then you have to burn up the equivalent of 15 miles to stay weight neutral - It also stands to reason that if you consume 1,500 cal/day and don't lose weight, then you must be burning up about 1,500 cals/day to stay weight neutral.     5. If you really want to lose weight, you must cut your calorie intake 300 calories /  day and at that rate you should lose about 1 # every 3 days.   6. Please purchase Dr Francis DowseJoel Fuhrman's book(s) "The End of Dieting" & "Eat to Live" . It has some great concepts and recipes.

## 2017-02-04 LAB — LIPID PANEL
Cholesterol: 147 mg/dL (ref <200)
HDL: 54 mg/dL (ref >40)
LDL Cholesterol: 56 mg/dL (ref <100)
TRIGLYCERIDES: 183 mg/dL — AB (ref <150)
Total CHOL/HDL Ratio: 2.7 Ratio (ref <5.0)
VLDL: 37 mg/dL — ABNORMAL HIGH (ref <30)

## 2017-02-04 LAB — HEPATIC FUNCTION PANEL
ALBUMIN: 4.8 g/dL (ref 3.6–5.1)
ALK PHOS: 57 U/L (ref 40–115)
ALT: 29 U/L (ref 9–46)
AST: 22 U/L (ref 10–35)
BILIRUBIN DIRECT: 0.2 mg/dL (ref <=0.2)
BILIRUBIN INDIRECT: 0.6 mg/dL (ref 0.2–1.2)
Total Bilirubin: 0.8 mg/dL (ref 0.2–1.2)
Total Protein: 7.5 g/dL (ref 6.1–8.1)

## 2017-02-04 LAB — BASIC METABOLIC PANEL WITH GFR
BUN: 11 mg/dL (ref 7–25)
CALCIUM: 10.2 mg/dL (ref 8.6–10.3)
CO2: 26 mmol/L (ref 20–31)
Chloride: 98 mmol/L (ref 98–110)
Creat: 0.81 mg/dL (ref 0.70–1.33)
GFR, Est African American: >89 mL/min (ref >=60)
Glucose, Bld: 93 mg/dL (ref 65–99)
POTASSIUM: 5.1 mmol/L (ref 3.5–5.3)
SODIUM: 137 mmol/L (ref 135–146)

## 2017-02-04 LAB — VITAMIN D 25 HYDROXY (VIT D DEFICIENCY, FRACTURES): Vit D, 25-Hydroxy: 46 ng/mL (ref 30–100)

## 2017-04-28 ENCOUNTER — Ambulatory Visit: Payer: Self-pay | Admitting: Internal Medicine

## 2017-04-29 ENCOUNTER — Other Ambulatory Visit: Payer: Self-pay | Admitting: Internal Medicine

## 2017-04-29 DIAGNOSIS — I10 Essential (primary) hypertension: Secondary | ICD-10-CM

## 2017-10-30 ENCOUNTER — Encounter: Payer: Self-pay | Admitting: Internal Medicine

## 2017-11-02 ENCOUNTER — Encounter: Payer: Self-pay | Admitting: Internal Medicine

## 2017-11-02 NOTE — Patient Instructions (Signed)

## 2017-11-02 NOTE — Progress Notes (Signed)
[facility]   Lucky Cowboy, M.D.     Dyanne Carrel. Steffanie Dunn, P.A.-C Judd Gaudier, DNP [facility]                [address]                [place], [place]. [address] Telephone [phone] Telefax [phone] Annual  Screening/Preventative Visit  & Comprehensive Evaluation & Examination     This very nice 52 y.o. MWM  presents for a Screening/Preventative Visit & comprehensive evaluation and management of multiple medical co-morbidities.  Patient has been followed for HTN, HLD, Prediabetes and Vitamin D Deficiency.     Patient has a very remote hx/o ulcertative colitis and last Colon in 2008 was Negative.      HTN predates circa 2003. Patient's BP has been controlled at home.  Today's BP is at goal - 122/72. Patient denies any cardiac symptoms as chest pain, palpitations, shortness of breath, dizziness or ankle swelling.     Patient's hyperlipidemia is controlled with diet and medications. Patient denies myalgias or other medication SE's. Last lipids were at goal albeit sl elevated Trig's: Lab Results  Component Value Date   CHOL 147 02/03/2017   HDL 54 02/03/2017   LDLCALC 56 02/03/2017   TRIG 183 (H) 02/03/2017   CHOLHDL 2.7 02/03/2017      Patient is actively surveyed expectantly for prediabetes  and patient denies reactive hypoglycemic symptoms, visual blurring, diabetic polys or paresthesias. Last A1c was Normal & at goal: Lab Results  Component Value Date   HGBA1C 4.9 10/07/2016       Finally, patient has history of Vitamin D Deficiency ("20"/2008)  and last vitamin D was still low: Lab Results  Component Value Date   VD25OH 46 02/03/2017   Current Outpatient Medications on File Prior to Visit  Medication Sig  . ALPRAZolam (XANAX) 1 MG tablet Take 1 tablet (1 mg total) by mouth 3 (three) times daily as needed for anxiety.  Marland Kitchen lisinopril (PRINIVIL,ZESTRIL) 20 MG tablet Take 1 tablet (20 mg total) by  mouth daily.  . Vitamin D, Ergocalciferol, (DRISDOL) 50000 units CAPS capsule Take 1 capsule (50,000 Units total) by mouth daily.   No current facility-administered medications on file prior to visit.    No Known Allergies   Past Medical History:  Diagnosis Date  . Anxiety   . Colitis, ulcerative (HCC)   . Hyperlipidemia   . Hypertension   . Hypogonadism male   . Vitamin D deficiency    Health Maintenance  Topic Date Due  . HIV Screening  03/27/1980  . INFLUENZA VACCINE  02/01/2018  . TETANUS/TDAP  07/06/2018  . COLONOSCOPY  05/11/2023   Immunization History  Administered Date(s) Administered  . PPD Test 08/29/2014, 09/04/2015, 10/07/2016  . Pneumococcal-Unspecified 06/05/2002  . Tdap 07/06/2008   Last Colon - 04/2007 - Normal - [facility]. History reviewed. No pertinent surgical history. Family History  Problem Relation Age of Onset  . Cancer Mother        ocular  . Thyroid disease Mother   . Hypertension Father   . Hyperlipidemia Father    Social History   Socioeconomic History  . Marital status: Unknown    Spouse name: Not on file  . Number of children: Not on file  Occupational History  . Not on file  Tobacco Use  . Smoking status: Never Smoker  . Smokeless tobacco: Never Used  Substance and Sexual Activity  .  Alcohol use: Yes    Alcohol/week: Comment: occasionally  . Drug use: No  . Sexual activity: Yes    ROS Constitutional: Denies fever, chills, weight loss/gain, headaches, insomnia,  night sweats or change in appetite. Does c/o fatigue. Eyes: Denies redness, blurred vision, diplopia, discharge, itchy or watery eyes.  ENT: Denies discharge, congestion, post nasal drip, epistaxis, sore throat, earache, hearing loss, dental pain, Tinnitus, Vertigo, Sinus pain or snoring.  Cardio: Denies chest pain, palpitations, irregular heartbeat, syncope, dyspnea, diaphoresis, orthopnea, PND, claudication or edema Respiratory: denies cough, dyspnea, DOE, pleurisy,  hoarseness, laryngitis or wheezing.  Gastrointestinal: Denies dysphagia, heartburn, reflux, water brash, pain, cramps, nausea, vomiting, bloating, diarrhea, constipation, hematemesis, melena, hematochezia, jaundice or hemorrhoids Genitourinary: Denies dysuria, frequency, urgency, nocturia, hesitancy, discharge, hematuria or flank pain Musculoskeletal: Denies arthralgia, myalgia, stiffness, Jt. Swelling, pain, limp or strain/sprain. Denies Falls. Skin: Denies puritis, rash, hives, warts, acne, eczema or change in skin lesion Neuro: No weakness, tremor, incoordination, spasms, paresthesia or pain Psychiatric: Denies confusion, memory loss or sensory loss. Denies Depression. Endocrine: Denies change in weight, skin, hair change, nocturia, and paresthesia, diabetic polys, visual blurring or hyper / hypo glycemic episodes.  Heme/Lymph: No excessive bleeding, bruising or enlarged lymph nodes.  Physical Exam  BP 122/72   Pulse (!) 48   Temp (!) 97.5 F (36.4 C)   Resp 16   Ht  (1.778 m)   Wt 224 lb 3.2 oz (101.7 kg)   BMI 32.17 kg/m   General Appearance: Well nourished and well groomed and in no apparent distress.  Eyes: PERRLA, EOMs, conjunctiva no swelling or erythema, normal fundi and vessels. Sinuses: No frontal/maxillary tenderness ENT/Mouth: EACs patent / TMs  nl. Nares clear without erythema, swelling, mucoid exudates. Oral hygiene is good. No erythema, swelling, or exudate. Tongue normal, non-obstructing. Tonsils not swollen or erythematous. Hearing normal.  Neck: Supple, thyroid not palpable. No bruits, nodes or JVD. Respiratory: Respiratory effort normal.  BS equal and clear bilateral without rales, rhonci, wheezing or stridor. Cardio: Heart sounds are normal with regular rate and rhythm and no murmurs, rubs or gallops. Peripheral pulses are normal and equal bilaterally without edema. No aortic or femoral bruits. Chest: symmetric with normal excursions and percussion.   Abdomen: Soft, with Nl bowel sounds. Nontender, no guarding, rebound, hernias, masses, or organomegaly.  Lymphatics: Non tender without lymphadenopathy.  Genitourinary: No hernias.Testes nl. DRE - prostate nl for age - smooth & firm w/o nodules. Musculoskeletal: Full ROM all peripheral extremities, joint stability, 5/5 strength, and normal gait. Skin: Warm and dry without rashes, lesions, cyanosis, clubbing or  ecchymosis.  Neuro: Cranial nerves intact, reflexes equal bilaterally. Normal muscle tone, no cerebellar symptoms. Sensation intact.  Pysch: Alert and oriented X 3 with normal affect, insight and judgment appropriate.   Assessment and Plan  1. Annual Preventative/Screening Exam   2. Essential hypertension  - EKG 12-Lead - [place], RETROPERITNL ABD,  LTD - Urinalysis, Routine w reflex microscopic - Microalbumin / creatinine urine ratio - CBC with Differential/Platelet - COMPLETE METABOLIC PANEL WITH GFR - Magnesium - TSH  3. Hyperlipidemia, mixed  - EKG 12-Lead - [place], RETROPERITNL ABD,  LTD - Lipid panel  4. Abnormal glucose  - EKG 12-Lead - [place], RETROPERITNL ABD,  LTD - Hemoglobin A1c - Insulin, random  5. Vitamin D deficiency  - VITAMIN D 25 Hydroxy  6. Prediabetes  - EKG 12-Lead - [place], RETROPERITNL ABD,  LTD - Hemoglobin A1c - Insulin, random  7. Screening for rectal cancer  8. Prostate cancer screening  - PSA  9. Screening examination for pulmonary tuberculosis   10. Screening for ischemic heart disease  - EKG 12-Lead  11. FHx: heart disease  - EKG 12-Lead  12. Screening for AAA (aortic abdominal aneurysm)  - [place], RETROPERITNL ABD,  LTD  13. Fatigue, unspecified type  - Iron,Total/Total Iron Binding Cap - Vitamin B12 - Testosterone  14. Medication management  - Urinalysis, Routine w reflex microscopic - Microalbumin / creatinine urine ratio - CBC with Differential/Platelet - COMPLETE METABOLIC PANEL WITH GFR - Magnesium - Lipid  panel - TSH - Hemoglobin A1c - Insulin, random - VITAMIN D 25 Hydroxyl         Patient was counseled in prudent diet, weight control to achieve/maintain BMI less than 25, BP monitoring, regular exercise and medications as discussed.  Discussed med effects and SE's. Routine screening labs and tests as requested with regular follow-up as recommended. Over 40 minutes of exam, counseling, chart review and high complex critical decision making was performed

## 2017-11-03 ENCOUNTER — Ambulatory Visit (INDEPENDENT_AMBULATORY_CARE_PROVIDER_SITE_OTHER): Payer: BLUE CROSS/BLUE SHIELD | Admitting: Internal Medicine

## 2017-11-03 ENCOUNTER — Other Ambulatory Visit: Payer: Self-pay | Admitting: *Deleted

## 2017-11-03 VITALS — BP 122/72 | HR 48 | Temp 97.5°F | Resp 16 | Ht 70.0 in | Wt 224.2 lb

## 2017-11-03 DIAGNOSIS — I10 Essential (primary) hypertension: Secondary | ICD-10-CM | POA: Diagnosis not present

## 2017-11-03 DIAGNOSIS — Z1329 Encounter for screening for other suspected endocrine disorder: Secondary | ICD-10-CM

## 2017-11-03 DIAGNOSIS — R350 Frequency of micturition: Secondary | ICD-10-CM

## 2017-11-03 DIAGNOSIS — Z111 Encounter for screening for respiratory tuberculosis: Secondary | ICD-10-CM

## 2017-11-03 DIAGNOSIS — Z1212 Encounter for screening for malignant neoplasm of rectum: Secondary | ICD-10-CM

## 2017-11-03 DIAGNOSIS — Z0001 Encounter for general adult medical examination with abnormal findings: Secondary | ICD-10-CM

## 2017-11-03 DIAGNOSIS — E782 Mixed hyperlipidemia: Secondary | ICD-10-CM

## 2017-11-03 DIAGNOSIS — N401 Enlarged prostate with lower urinary tract symptoms: Secondary | ICD-10-CM | POA: Diagnosis not present

## 2017-11-03 DIAGNOSIS — Z79899 Other long term (current) drug therapy: Secondary | ICD-10-CM

## 2017-11-03 DIAGNOSIS — R7309 Other abnormal glucose: Secondary | ICD-10-CM

## 2017-11-03 DIAGNOSIS — Z1389 Encounter for screening for other disorder: Secondary | ICD-10-CM

## 2017-11-03 DIAGNOSIS — Z131 Encounter for screening for diabetes mellitus: Secondary | ICD-10-CM

## 2017-11-03 DIAGNOSIS — Z0000 Encounter for general adult medical examination without abnormal findings: Secondary | ICD-10-CM

## 2017-11-03 DIAGNOSIS — E559 Vitamin D deficiency, unspecified: Secondary | ICD-10-CM | POA: Diagnosis not present

## 2017-11-03 DIAGNOSIS — Z130 Encounter for screening for diseases of the blood and blood-forming organs and certain disorders involving the immune mechanism: Secondary | ICD-10-CM | POA: Diagnosis not present

## 2017-11-03 DIAGNOSIS — Z136 Encounter for screening for cardiovascular disorders: Secondary | ICD-10-CM | POA: Diagnosis not present

## 2017-11-03 DIAGNOSIS — Z125 Encounter for screening for malignant neoplasm of prostate: Secondary | ICD-10-CM | POA: Diagnosis not present

## 2017-11-03 DIAGNOSIS — R7303 Prediabetes: Secondary | ICD-10-CM

## 2017-11-03 DIAGNOSIS — Z13220 Encounter for screening for lipoid disorders: Secondary | ICD-10-CM

## 2017-11-03 DIAGNOSIS — R5383 Other fatigue: Secondary | ICD-10-CM

## 2017-11-03 DIAGNOSIS — Z8249 Family history of ischemic heart disease and other diseases of the circulatory system: Secondary | ICD-10-CM

## 2017-11-03 MED ORDER — ALPRAZOLAM 1 MG PO TABS: ORAL_TABLET | ORAL | 0 refills | Status: DC

## 2017-11-03 MED ORDER — CLOTRIMAZOLE-BETAMETHASONE 1-0.05 % EX CREA: TOPICAL_CREAM | CUTANEOUS | 1 refills | Status: DC

## 2017-11-03 MED ORDER — ATENOLOL 100 MG PO TABS
100.0000 mg | ORAL_TABLET | Freq: Every day | ORAL | 1 refills | Status: DC
Start: 2017-11-03 — End: 2018-12-13

## 2017-11-03 MED ORDER — ROSUVASTATIN CALCIUM 20 MG PO TABS: ORAL_TABLET | ORAL | 1 refills | Status: DC

## 2017-11-03 MED ORDER — EZETIMIBE 10 MG PO TABS: 10.0000 mg | ORAL_TABLET | Freq: Every day | ORAL | 1 refills | Status: DC

## 2017-11-04 ENCOUNTER — Other Ambulatory Visit: Payer: Self-pay | Admitting: Internal Medicine

## 2017-11-06 LAB — LIPID PANEL
CHOLESTEROL: 118 mg/dL (ref <200)
HDL: 37 mg/dL — AB (ref >40)
LDL Cholesterol (Calc): 57 mg/dL (calc)
Non-HDL Cholesterol (Calc): 81 mg/dL (calc) (ref <130)
Total CHOL/HDL Ratio: 3.2 (calc) (ref <5.0)
Triglycerides: 166 mg/dL — ABNORMAL HIGH (ref <150)

## 2017-11-06 LAB — CBC WITH DIFFERENTIAL/PLATELET
Basophils Absolute: 80 cells/uL (ref 0–200)
Basophils Relative: 1 %
EOS PCT: 3 %
Eosinophils Absolute: 240 cells/uL (ref 15–500)
HCT: 42 % (ref 38.5–50.0)
HEMOGLOBIN: 14.7 g/dL (ref 13.2–17.1)
Lymphs Abs: 2176 cells/uL (ref 850–3900)
MCH: 31.3 pg (ref 27.0–33.0)
MCHC: 35 g/dL (ref 32.0–36.0)
MCV: 89.6 fL (ref 80.0–100.0)
MONOS PCT: 8.3 %
MPV: 10 fL (ref 7.5–12.5)
NEUTROS ABS: 4840 {cells}/uL (ref 1500–7800)
Neutrophils Relative %: 60.5 %
PLATELETS: 259 10*3/uL (ref 140–400)
RBC: 4.69 10*6/uL (ref 4.20–5.80)
RDW: 12.4 % (ref 11.0–15.0)
TOTAL LYMPHOCYTE: 27.2 %
WBC mixed population: 664 cells/uL (ref 200–950)
WBC: 8 10*3/uL (ref 3.8–10.8)

## 2017-11-06 LAB — COMPLETE METABOLIC PANEL WITH GFR
AG Ratio: 2.4 (calc) (ref 1.0–2.5)
ALKALINE PHOSPHATASE (APISO): 53 U/L (ref 40–115)
ALT: 31 U/L (ref 9–46)
AST: 24 U/L (ref 10–35)
Albumin: 5 g/dL (ref 3.6–5.1)
BILIRUBIN TOTAL: 0.8 mg/dL (ref 0.2–1.2)
BUN: 14 mg/dL (ref 7–25)
CHLORIDE: 100 mmol/L (ref 98–110)
CO2: 30 mmol/L (ref 20–32)
CREATININE: 0.76 mg/dL (ref 0.70–1.33)
Calcium: 9.7 mg/dL (ref 8.6–10.3)
GFR, Est African American: 122 mL/min/{1.73_m2} (ref >=60)
GFR, Est Non African American: 105 mL/min/{1.73_m2} (ref >=60)
GLUCOSE: 97 mg/dL (ref 65–99)
Globulin: 2.1 g/dL (calc) (ref 1.9–3.7)
Potassium: 4.7 mmol/L (ref 3.5–5.3)
Sodium: 138 mmol/L (ref 135–146)
Total Protein: 7.1 g/dL (ref 6.1–8.1)

## 2017-11-06 LAB — URINALYSIS, ROUTINE W REFLEX MICROSCOPIC
BACTERIA UA: NONE SEEN /HPF
Bilirubin Urine: NEGATIVE
Glucose, UA: NEGATIVE
HYALINE CAST: NONE SEEN /LPF
Hgb urine dipstick: NEGATIVE
Ketones, ur: NEGATIVE
Leukocytes, UA: NEGATIVE
NITRITE: NEGATIVE
Protein, ur: NEGATIVE
RBC / HPF: NONE SEEN /HPF (ref 0–2)
Specific Gravity, Urine: 1.012 (ref 1.001–1.03)
Squamous Epithelial / LPF: NONE SEEN /HPF (ref <=5)
WBC, UA: NONE SEEN /HPF (ref 0–5)
pH: 5.5 (ref 5.0–8.0)

## 2017-11-06 LAB — MICROALBUMIN / CREATININE URINE RATIO
CREATININE, URINE: 73 mg/dL (ref 20–320)
MICROALB/CREAT RATIO: 4 ug/mg{creat} (ref <30)
Microalb, Ur: 0.3 mg/dL

## 2017-11-06 LAB — PSA: PSA: 0.3 ng/mL (ref <=4.0)

## 2017-11-06 LAB — HEMOGLOBIN A1C
EAG (MMOL/L): 5.8 (calc)
Hgb A1c MFr Bld: 5.3 % of total Hgb (ref <5.7)
Mean Plasma Glucose: 105 (calc)

## 2017-11-06 LAB — IRON, TOTAL/TOTAL IRON BINDING CAP
%SAT: 29 % (ref 15–60)
Iron: 109 ug/dL (ref 50–180)
TIBC: 370 ug/dL (ref 250–425)

## 2017-11-06 LAB — MAGNESIUM: MAGNESIUM: 2.2 mg/dL (ref 1.5–2.5)

## 2017-11-06 LAB — TSH: TSH: 1.62 m[IU]/L (ref 0.40–4.50)

## 2017-11-06 LAB — VITAMIN D 25 HYDROXY (VIT D DEFICIENCY, FRACTURES): Vit D, 25-Hydroxy: >150 ng/mL — ABNORMAL HIGH (ref 30–100)

## 2017-11-06 LAB — TESTOSTERONE: TESTOSTERONE: 206 ng/dL — AB (ref 250–827)

## 2017-11-06 LAB — VITAMIN B12: VITAMIN B 12: 596 pg/mL (ref 200–1100)

## 2017-11-06 LAB — INSULIN, RANDOM: Insulin: 6.8 u[IU]/mL (ref 2.0–19.6)

## 2018-02-22 ENCOUNTER — Ambulatory Visit: Payer: Self-pay | Admitting: Adult Health

## 2018-05-11 DIAGNOSIS — Z8719 Personal history of other diseases of the digestive system: Secondary | ICD-10-CM | POA: Diagnosis not present

## 2018-05-11 DIAGNOSIS — K5289 Other specified noninfective gastroenteritis and colitis: Secondary | ICD-10-CM | POA: Diagnosis not present

## 2018-05-11 LAB — HM COLONOSCOPY

## 2018-05-15 DIAGNOSIS — K5289 Other specified noninfective gastroenteritis and colitis: Secondary | ICD-10-CM | POA: Diagnosis not present

## 2018-06-04 ENCOUNTER — Ambulatory Visit: Payer: Self-pay | Admitting: Internal Medicine

## 2018-06-11 ENCOUNTER — Encounter: Payer: Self-pay | Admitting: *Deleted

## 2018-09-11 ENCOUNTER — Other Ambulatory Visit: Payer: Self-pay | Admitting: Internal Medicine

## 2018-09-11 DIAGNOSIS — E782 Mixed hyperlipidemia: Secondary | ICD-10-CM

## 2018-09-14 ENCOUNTER — Telehealth (INDEPENDENT_AMBULATORY_CARE_PROVIDER_SITE_OTHER): Payer: BLUE CROSS/BLUE SHIELD | Admitting: Internal Medicine

## 2018-09-14 ENCOUNTER — Other Ambulatory Visit: Payer: Self-pay | Admitting: Internal Medicine

## 2018-09-14 DIAGNOSIS — K529 Noninfective gastroenteritis and colitis, unspecified: Secondary | ICD-10-CM | POA: Diagnosis not present

## 2018-09-14 DIAGNOSIS — R112 Nausea with vomiting, unspecified: Secondary | ICD-10-CM

## 2018-09-14 MED ORDER — ONDANSETRON 8 MG PO TBDP: ORAL_TABLET | ORAL | 0 refills | Status: DC

## 2018-09-14 MED ORDER — HYOSCYAMINE SULFATE 0.125 MG SL SUBL: SUBLINGUAL_TABLET | SUBLINGUAL | 0 refills | Status: DC

## 2018-09-14 NOTE — Telephone Encounter (Signed)
 -   Patient called  (6:55 am) with 18 hr hx/o refractory N/V & diarrhea. No blood in emesis or BM's. Denies abd pain. No fever, chills, sweats or respiratory sx. -wrt the Covid 9 virus - avoid travel to places of reported cases or persons who have traveled to those places. - I would also for the time being avoid large crowds, events, or airplane  9 Crowded poorly ventilated areas with persons of unknown exposures-19 exposure  / travel  Medication Sig  . ALPRAZolam (XANAX) 1 MG tablet Take 1/2 to 1 tablet 2 to 3 x / day ONLY if needed for Anxiety Attack  & please try to limit to 5 days /week to avoid addiction  . atenolol (TENORMIN) 100 MG tablet Take 1 tablet  daily.  . clotrimazole-betamethasone (LOTRISONE) cream Apply to affected area 2-4 x /daily  . ezetimibe (ZETIA) 10 MG tablet Take 1 tablet  daily.  Marland Kitchen lisinopril (PRINIVIL,ZESTRIL) 20 MG tablet Take 1 tablet  daily.  . rosuvastatin (CRESTOR) 20 MG tablet TAKE 1 TABLET  DAILY OR AS DIRECTED FOR CHOLESTEROL  . Vitamin D, Ergocalciferol, (DRISDOL) 50000 units CAPS capsule Take 1 capsule  daily.   Patient Active Problem List   Diagnosis Date Noted  . Vitamin D deficiency 10/11/2013  . Prediabetes 10/11/2013  . Medication management 10/11/2013  . Hypertension   . Hyperlipidemia, mixed   . Anxiety     -1. Intractable vomiting with nausea, unspecified vomiting type  - ondansetron (ZOFRAN ODT) 8 MG disintegrating tablet; Dissolve 1 tablet under tongue every 6 hours for nausea  or vomitting  Dispense: 30 tablet; Refill: 0 - hyoscyamine (LEVSIN SL) 0.125 MG SL tablet; dissolve 1 to 2 tablets under tongue  3 to 4 x day if needed for Nausea, vomiting, cramping or diarrhea  Dispense: 30 tablet; Refill: 0  2. Gastroenteritis, infectious, presumed  - ondansetron (ZOFRAN ODT) 8 MG disintegrating tablet; Dissolve 1 tablet under tongue every 6 hours for nausea  or vomitting  Dispense: 30 tablet; Refill: 0 - hyoscyamine (LEVSIN SL) 0.125 MG SL  tablet; dissolve 1 to 2 tablets under tongue  3 to 4 x day if needed for Nausea, vomiting, cramping or diarrhea  Dispense: 30 tablet; Refill: 0  - discussed diet and return or ER precautions.   Over 12 minutes of counseling,  chart review and critical decision making was performed

## 2018-09-15 MED ORDER — ONDANSETRON 8 MG PO TBDP: ORAL_TABLET | ORAL | 0 refills | Status: DC

## 2018-09-15 NOTE — Addendum Note (Signed)
Addended by: Doree Albee on: 09/15/2018 09:38 AM   Modules accepted: Orders

## 2018-11-29 ENCOUNTER — Encounter: Payer: Self-pay | Admitting: Internal Medicine

## 2018-12-03 ENCOUNTER — Encounter: Payer: Self-pay | Admitting: Internal Medicine

## 2018-12-13 ENCOUNTER — Other Ambulatory Visit: Payer: Self-pay

## 2018-12-13 ENCOUNTER — Ambulatory Visit: Payer: BC Managed Care – PPO | Admitting: Internal Medicine

## 2018-12-13 VITALS — BP 144/86 | HR 56 | Temp 97.2°F | Resp 16 | Ht 70.0 in | Wt 224.8 lb

## 2018-12-13 DIAGNOSIS — Z130 Encounter for screening for diseases of the blood and blood-forming organs and certain disorders involving the immune mechanism: Secondary | ICD-10-CM

## 2018-12-13 DIAGNOSIS — Z0000 Encounter for general adult medical examination without abnormal findings: Secondary | ICD-10-CM | POA: Diagnosis not present

## 2018-12-13 DIAGNOSIS — R350 Frequency of micturition: Secondary | ICD-10-CM

## 2018-12-13 DIAGNOSIS — Z8249 Family history of ischemic heart disease and other diseases of the circulatory system: Secondary | ICD-10-CM

## 2018-12-13 DIAGNOSIS — Z1389 Encounter for screening for other disorder: Secondary | ICD-10-CM | POA: Diagnosis not present

## 2018-12-13 DIAGNOSIS — E559 Vitamin D deficiency, unspecified: Secondary | ICD-10-CM | POA: Diagnosis not present

## 2018-12-13 DIAGNOSIS — Z136 Encounter for screening for cardiovascular disorders: Secondary | ICD-10-CM | POA: Diagnosis not present

## 2018-12-13 DIAGNOSIS — Z79899 Other long term (current) drug therapy: Secondary | ICD-10-CM | POA: Diagnosis not present

## 2018-12-13 DIAGNOSIS — Z0001 Encounter for general adult medical examination with abnormal findings: Secondary | ICD-10-CM

## 2018-12-13 DIAGNOSIS — I10 Essential (primary) hypertension: Secondary | ICD-10-CM | POA: Diagnosis not present

## 2018-12-13 DIAGNOSIS — Z1329 Encounter for screening for other suspected endocrine disorder: Secondary | ICD-10-CM

## 2018-12-13 DIAGNOSIS — Z23 Encounter for immunization: Secondary | ICD-10-CM

## 2018-12-13 DIAGNOSIS — Z131 Encounter for screening for diabetes mellitus: Secondary | ICD-10-CM

## 2018-12-13 DIAGNOSIS — Z13220 Encounter for screening for lipoid disorders: Secondary | ICD-10-CM

## 2018-12-13 DIAGNOSIS — Z125 Encounter for screening for malignant neoplasm of prostate: Secondary | ICD-10-CM | POA: Diagnosis not present

## 2018-12-13 DIAGNOSIS — Z111 Encounter for screening for respiratory tuberculosis: Secondary | ICD-10-CM | POA: Diagnosis not present

## 2018-12-13 DIAGNOSIS — Z1211 Encounter for screening for malignant neoplasm of colon: Secondary | ICD-10-CM

## 2018-12-13 DIAGNOSIS — R5383 Other fatigue: Secondary | ICD-10-CM

## 2018-12-13 DIAGNOSIS — N401 Enlarged prostate with lower urinary tract symptoms: Secondary | ICD-10-CM | POA: Diagnosis not present

## 2018-12-13 DIAGNOSIS — R7303 Prediabetes: Secondary | ICD-10-CM

## 2018-12-13 DIAGNOSIS — R7309 Other abnormal glucose: Secondary | ICD-10-CM

## 2018-12-13 DIAGNOSIS — E782 Mixed hyperlipidemia: Secondary | ICD-10-CM

## 2018-12-13 MED ORDER — EZETIMIBE 10 MG PO TABS: ORAL_TABLET | ORAL | 3 refills | Status: DC

## 2018-12-13 MED ORDER — TAMSULOSIN HCL 0.4 MG PO CAPS: ORAL_CAPSULE | ORAL | 3 refills | Status: DC

## 2018-12-13 MED ORDER — ROSUVASTATIN CALCIUM 20 MG PO TABS: ORAL_TABLET | ORAL | 3 refills | Status: DC

## 2018-12-13 MED ORDER — LISINOPRIL 20 MG PO TABS: ORAL_TABLET | ORAL | 3 refills | Status: DC

## 2018-12-13 MED ORDER — FINASTERIDE 5 MG PO TABS: ORAL_TABLET | ORAL | 3 refills | Status: DC

## 2018-12-13 MED ORDER — ATENOLOL 100 MG PO TABS: ORAL_TABLET | ORAL | 3 refills | Status: DC

## 2018-12-13 NOTE — Progress Notes (Signed)
[facility]  Zachary Peters, M.D.        Dyanne CarrelAmanda R. Steffanie Dunnollier, P.A.-C         Judd GaudierAshley Corbett, DNP [facility]                [address]                [place], [place] [id number] Telephone [phone] Telefax [phone]) [phone] Annual  Screening/Preventative Visit  & Comprehensive Evaluation & Examination     This very nice 53 y.o. MWM presents for a Screening /Preventative Visit & comprehensive evaluation and management of multiple medical co-morbidities.  Patient has been followed for HTN, HLD, Prediabetes and Vitamin D Deficiency.     HTN predates since     . Patient's BP has been controlled at home.  Today's BP is elevated at 144/86. Patient denies any cardiac symptoms as chest pain, palpitations, shortness of breath, dizziness or ankle swelling.     Patient's hyperlipidemia is controlled with diet and medications. Patient denies myalgias or other medication SE's. Last lipids were  Lab Results  Component Value Date   CHOL 118 11/03/2017   HDL 37 (L) 11/03/2017   LDLCALC 57 11/03/2017   TRIG 166 (H) 11/03/2017   CHOLHDL 3.2 11/03/2017      Patient is monitored expectantly for glucose intolerance and patient denies reactive hypoglycemic symptoms, visual blurring, diabetic polys or paresthesias. Last A1c was Normal & at goal: Lab Results  Component Value Date   HGBA1C 5.3 11/03/2017       Finally, patient has history of Vitamin D Deficiency ("20" / 2008)  and last vitamin D was  Lab Results  Component Value Date   VD25OH 38>150 (H) 11/03/2017   Current Outpatient Medications on File Prior to Visit  Medication Sig  . atenolol (TENORMIN) 100 MG tablet Take 1 tablet (100 mg total) by mouth daily.  Marland Kitchen. ezetimibe (ZETIA) 10 MG tablet Take 1 tablet (10 mg total) by mouth daily.  Marland Kitchen. lisinopril (PRINIVIL,ZESTRIL) 20 MG tablet Take 1 tablet (20 mg total) by mouth daily.  . rosuvastatin (CRESTOR) 20 MG tablet TAKE 1 TABLET BY  MOUTH DAILY OR AS DIRECTED FOR CHOLESTEROL  . Vitamin D, Ergocalciferol, (DRISDOL) 50000 units CAPS capsule Take 1 capsule (50,000 Units total) by mouth daily.   No current facility-administered medications on file prior to visit.    No Known Allergies   Past Medical History:  Diagnosis Date  . Anxiety   . Colitis, ulcerative (HCC)   . Hyperlipidemia   . Hypertension   . Hypogonadism male   . Vitamin D deficiency    Health Maintenance  Topic Date Due  . HIV Screening  03/27/1980  . TETANUS/TDAP  07/06/2018  . INFLUENZA VACCINE  02/02/2019  . COLONOSCOPY  05/12/2023   Immunization History  Administered Date(s) Administered  . PPD Test 08/29/2014, 09/04/2015, 10/07/2016, 11/03/2017  . Pneumococcal-Unspecified 06/05/2002  . Tdap 07/06/2008   Last Colon - 05/11/2018 - Dr Randa EvensEdwards  Family History  Problem Relation Age of Onset  . Cancer Mother        ocular  . Thyroid disease Mother   . Hypertension Father   . Hyperlipidemia Father    Social History   Socioeconomic History  . Marital status: Unknown    Spouse name: Not on file  . Number of children: Not on file  . Years of education: Not on file  . Highest education level: Not on file  Occupational History  . Works at [employer]  . Smoking status: Never Smoker  . Smokeless tobacco: Never Used  Substance and Sexual Activity  . Alcohol use: Yes    Alcohol/week: 0.0 standard drinks    Comment: occasionally  . Drug use: No  . Sexual activity: Yes    ROS Constitutional: Denies fever, chills, weight loss/gain, headaches, insomnia,  night sweats or change in appetite. Does c/o fatigue. Eyes: Denies redness, blurred vision, diplopia, discharge, itchy or watery eyes.  ENT: Denies discharge, congestion, post nasal drip, epistaxis, sore throat, earache, hearing loss, dental pain, Tinnitus, Vertigo, Sinus pain or snoring.  Cardio: Denies chest pain, palpitations, irregular heartbeat, syncope, dyspnea,  diaphoresis, orthopnea, PND, claudication or edema Respiratory: denies cough, dyspnea, DOE, pleurisy, hoarseness, laryngitis or wheezing.  Gastrointestinal: Denies dysphagia, heartburn, reflux, water brash, pain, cramps, nausea, vomiting, bloating, diarrhea, constipation, hematemesis, melena, hematochezia, jaundice or hemorrhoids Genitourinary: Denies dysuria, frequency, urgency, nocturia, hesitancy, discharge, hematuria or flank pain Musculoskeletal: Denies arthralgia, myalgia, stiffness, Jt. Swelling, pain, limp or strain/sprain. Denies Falls. Skin: Denies puritis, rash, hives, warts, acne, eczema or change in skin lesion Neuro: No weakness, tremor, incoordination, spasms, paresthesia or pain Psychiatric: Denies confusion, memory loss or sensory loss. Denies Depression. Endocrine: Denies change in weight, skin, hair change, nocturia, and paresthesia, diabetic polys, visual blurring or hyper / hypo glycemic episodes.  Heme/Lymph: No excessive bleeding, bruising or enlarged lymph nodes.  Physical Exam  BP (!) 144/86   Pulse (!) 56   Temp (!) 97.2 F (36.2 C)   Resp 16   Ht 5\' 10"  (1.778 m)   Wt 224 lb 12.8 oz (102 kg)   BMI 32.26 kg/m   General Appearance: Well nourished and well groomed and in no apparent distress.  Eyes: PERRLA, EOMs, conjunctiva no swelling or erythema, normal fundi and vessels. Sinuses: No frontal/maxillary tenderness ENT/Mouth: EACs patent / TMs  nl. Nares clear without erythema, swelling, mucoid exudates. Oral hygiene is good. No erythema, swelling, or exudate. Tongue normal, non-obstructing. Tonsils not swollen or erythematous. Hearing normal.  Neck: Supple, thyroid not palpable. No bruits, nodes or JVD. Respiratory: Respiratory effort normal.  BS equal and clear bilateral without rales, rhonci, wheezing or stridor. Cardio: Heart sounds are normal with regular rate and rhythm and no murmurs, rubs or gallops. Peripheral pulses are normal and equal bilaterally  without edema. No aortic or femoral bruits. Chest: symmetric with normal excursions and percussion.  Abdomen: Soft, with Nl bowel sounds. Nontender, no guarding, rebound, hernias, masses, or organomegaly.  Lymphatics: Non tender without lymphadenopathy.  Musculoskeletal: Full ROM all peripheral extremities, joint stability, 5/5 strength, and normal gait. Skin: Warm and dry without rashes, lesions, cyanosis, clubbing or  ecchymosis.  Neuro: Cranial nerves intact, reflexes equal bilaterally. Normal muscle tone, no cerebellar symptoms. Sensation intact.  Pysch: Alert and oriented X 3 with normal affect, insight and judgment appropriate.   Assessment and Plan  1. Annual Preventative/Screening Exam   2. Essential hypertension  - EKG 12-Lead - [place], RETROPERITNL ABD,  LTD - Urinalysis, Routine w reflex microscopic - Microalbumin / creatinine urine ratio - CBC with Differential/Platelet - COMPLETE METABOLIC PANEL WITH GFR - Magnesium - TSH - atenolol (TENORMIN) 100 MG tablet; Take 1 tablet every Morning for BP  Dispense: 90 tablet; Refill: 3 - lisinopril (ZESTRIL) 20 MG tablet; Take 1 tablet every Nite for BP  Dispense: 90 tablet; Refill: 3  3. Hyperlipidemia, mixed  - EKG 12-Lead - [place], RETROPERITNL ABD,  LTD -  Lipid panel - TSH - ezetimibe (ZETIA) 10 MG tablet; Take 1 tablet every day for Cholesterol  Dispense: 90 tablet; Refill: 3 - rosuvastatin (CRESTOR) 20 MG tablet; Take 1 tablet Daily for Cholesterol  Dispense: 90 tablet; Refill: 3  4. Abnormal glucose  - EKG 12-Lead - [place], RETROPERITNL ABD,  LTD - Hemoglobin A1c - Insulin, random  5. Vitamin D deficiency  - VITAMIN D 25 Hydroxyl  6. Prediabetes  - EKG 12-Lead - US, RETROPERITNL ABD,  LTD - Hemoglobin A1c - Insulin, random  7. Screening for colorectal cancer  - POC Hemoccult Bld/Stl  8. Prostate cancer screening  - PSA  9. Screening examination for pulmonary tuberculosis  - TB Skin Test  10. Screening for  ischemic heart disease  - EKG 12-Lead  11. FHx: heart disease  - EKG 12-Lead - US, RETROPERITNL ABD,  LTD  12. Screening for AAA (aortic abdominal aneurysm)  - US, RETROPERITNL ABD,  LTD  13. Fatigue  - Iron,Total/Total Iron Binding Cap - Vitamin B12 - Testosterone - CBC with Differential/Platelet  14. Medication management  - Urinalysis, Routine w reflex microscopic - Microalbumin / creatinine urine ratio - CBC with Differential/Platelet - COMPLETE METABOLIC PANEL WITH GFR - Magnesium - Lipid panel - TSH - Hemoglobin A1c - Insulin, random - VITAMIN D 25 Hydroxyl  15. Need for prophylactic vaccination with tetanus-diphtheria (Td)  - Td : Tetanus/diphtheria >7yo Preservative  free  16. Mixed hyperlipidemia   17. Benign localized prostatic hyperplasia with lower urinary tract symptoms (LUTS)  - finasteride (PROSCAR) 5 MG tablet; Take 1 tablet daily for prostate  Dispense: 90 tablet; Refill: 3 - tamsulosin (FLOMAX) 0.4 MG CAPS capsule; Take 1 capsule every Morning for Prostate  Dispense: 90 capsule; Refill: 3       Patient was counseled in prudent diet, weight control to achieve/maintain BMI less than 25, BP monitoring, regular exercise and medications as discussed.  Discussed med effects and SE's. Routine screening labs and tests as requested with regular follow-up as recommended. Over 40 minutes of exam, counseling, chart review and high complex critical decision making was performed   Marinus MawWilliam D Carlinda Ohlson, MD

## 2018-12-13 NOTE — Patient Instructions (Signed)

## 2018-12-14 ENCOUNTER — Other Ambulatory Visit: Payer: Self-pay | Admitting: Internal Medicine

## 2018-12-14 LAB — LIPID PANEL
Cholesterol: 133 mg/dL (ref <200)
HDL: 52 mg/dL (ref >=40)
LDL Cholesterol (Calc): 59 mg/dL (calc)
Non-HDL Cholesterol (Calc): 81 mg/dL (calc) (ref <130)
Total CHOL/HDL Ratio: 2.6 (calc) (ref <5.0)
Triglycerides: 139 mg/dL (ref <150)

## 2018-12-14 LAB — MAGNESIUM: Magnesium: 2.2 mg/dL (ref 1.5–2.5)

## 2018-12-14 LAB — COMPLETE METABOLIC PANEL WITH GFR
AG Ratio: 2.4 (calc) (ref 1.0–2.5)
ALT: 29 U/L (ref 9–46)
AST: 23 U/L (ref 10–35)
Albumin: 5.2 g/dL — ABNORMAL HIGH (ref 3.6–5.1)
Alkaline phosphatase (APISO): 59 U/L (ref 35–144)
BUN: 10 mg/dL (ref 7–25)
CO2: 26 mmol/L (ref 20–32)
Calcium: 10.2 mg/dL (ref 8.6–10.3)
Chloride: 101 mmol/L (ref 98–110)
Creat: 0.8 mg/dL (ref 0.70–1.33)
GFR, Est African American: 118 mL/min/{1.73_m2} (ref >=60)
GFR, Est Non African American: 102 mL/min/{1.73_m2} (ref >=60)
Globulin: 2.2 g/dL (calc) (ref 1.9–3.7)
Glucose, Bld: 89 mg/dL (ref 65–99)
Potassium: 4 mmol/L (ref 3.5–5.3)
Sodium: 139 mmol/L (ref 135–146)
Total Bilirubin: 0.7 mg/dL (ref 0.2–1.2)
Total Protein: 7.4 g/dL (ref 6.1–8.1)

## 2018-12-14 LAB — CBC WITH DIFFERENTIAL/PLATELET
Absolute Monocytes: 742 cells/uL (ref 200–950)
Basophils Absolute: 134 cells/uL (ref 0–200)
Basophils Relative: 1.3 %
Eosinophils Absolute: 330 cells/uL (ref 15–500)
Eosinophils Relative: 3.2 %
HCT: 45.3 % (ref 38.5–50.0)
Hemoglobin: 15.4 g/dL (ref 13.2–17.1)
Lymphs Abs: 2627 cells/uL (ref 850–3900)
MCH: 31.1 pg (ref 27.0–33.0)
MCHC: 34 g/dL (ref 32.0–36.0)
MCV: 91.5 fL (ref 80.0–100.0)
MPV: 10.3 fL (ref 7.5–12.5)
Monocytes Relative: 7.2 %
Neutro Abs: 6468 cells/uL (ref 1500–7800)
Neutrophils Relative %: 62.8 %
Platelets: 295 10*3/uL (ref 140–400)
RBC: 4.95 10*6/uL (ref 4.20–5.80)
RDW: 12.5 % (ref 11.0–15.0)
Total Lymphocyte: 25.5 %
WBC: 10.3 10*3/uL (ref 3.8–10.8)

## 2018-12-14 LAB — MICROALBUMIN / CREATININE URINE RATIO
Creatinine, Urine: 212 mg/dL (ref 20–320)
Microalb Creat Ratio: 4 mcg/mg creat (ref <30)
Microalb, Ur: 0.9 mg/dL

## 2018-12-14 LAB — VITAMIN B12: Vitamin B-12: 533 pg/mL (ref 200–1100)

## 2018-12-14 LAB — URINALYSIS, ROUTINE W REFLEX MICROSCOPIC
Bilirubin Urine: NEGATIVE
Glucose, UA: NEGATIVE
Hgb urine dipstick: NEGATIVE
Ketones, ur: NEGATIVE
Leukocytes,Ua: NEGATIVE
Nitrite: NEGATIVE
Protein, ur: NEGATIVE
Specific Gravity, Urine: 1.026 (ref 1.001–1.03)
pH: <=5 (ref 5.0–8.0)

## 2018-12-14 LAB — INSULIN, RANDOM: Insulin: 4.2 u[IU]/mL

## 2018-12-14 LAB — TESTOSTERONE: Testosterone: 228 ng/dL — ABNORMAL LOW (ref 250–827)

## 2018-12-14 LAB — HEMOGLOBIN A1C
Hgb A1c MFr Bld: 5.2 % of total Hgb (ref <5.7)
Mean Plasma Glucose: 103 (calc)
eAG (mmol/L): 5.7 (calc)

## 2018-12-14 LAB — IRON, TOTAL/TOTAL IRON BINDING CAP
%SAT: 14 % (calc) — ABNORMAL LOW (ref 20–48)
Iron: 60 ug/dL (ref 50–180)
TIBC: 415 mcg/dL (calc) (ref 250–425)

## 2018-12-14 LAB — PSA: PSA: 0.3 ng/mL (ref <=4.0)

## 2018-12-14 LAB — VITAMIN D 25 HYDROXY (VIT D DEFICIENCY, FRACTURES): Vit D, 25-Hydroxy: 117 ng/mL — ABNORMAL HIGH (ref 30–100)

## 2018-12-14 LAB — TSH: TSH: 2.45 mIU/L (ref 0.40–4.50)

## 2018-12-14 MED ORDER — ALPRAZOLAM 1 MG PO TABS: ORAL_TABLET | ORAL | 0 refills | Status: DC

## 2018-12-15 ENCOUNTER — Encounter: Payer: Self-pay | Admitting: Internal Medicine

## 2018-12-17 ENCOUNTER — Other Ambulatory Visit: Payer: Self-pay | Admitting: Internal Medicine

## 2018-12-17 LAB — TB SKIN TEST
Induration: 0 mm
TB Skin Test: NEGATIVE

## 2019-03-25 ENCOUNTER — Ambulatory Visit: Payer: BC Managed Care – PPO | Admitting: Adult Health

## 2019-06-24 ENCOUNTER — Ambulatory Visit: Payer: BC Managed Care – PPO | Admitting: Internal Medicine

## 2019-07-31 ENCOUNTER — Other Ambulatory Visit: Payer: Self-pay

## 2019-07-31 ENCOUNTER — Ambulatory Visit: Payer: BC Managed Care – PPO | Admitting: Internal Medicine

## 2019-07-31 ENCOUNTER — Encounter: Payer: Self-pay | Admitting: Internal Medicine

## 2019-07-31 VITALS — BP 116/76 | HR 60 | Temp 97.5°F | Resp 16 | Ht 70.0 in | Wt 227.0 lb

## 2019-07-31 DIAGNOSIS — Z79899 Other long term (current) drug therapy: Secondary | ICD-10-CM

## 2019-07-31 DIAGNOSIS — E782 Mixed hyperlipidemia: Secondary | ICD-10-CM | POA: Diagnosis not present

## 2019-07-31 DIAGNOSIS — E559 Vitamin D deficiency, unspecified: Secondary | ICD-10-CM

## 2019-07-31 DIAGNOSIS — I10 Essential (primary) hypertension: Secondary | ICD-10-CM | POA: Diagnosis not present

## 2019-07-31 DIAGNOSIS — R7309 Other abnormal glucose: Secondary | ICD-10-CM

## 2019-07-31 DIAGNOSIS — R7303 Prediabetes: Secondary | ICD-10-CM

## 2019-07-31 NOTE — Progress Notes (Signed)
History of Present Illness:      This very nice 54 y.o. MWM presents for 6 month follow up with HTN, HLD, Pre-Diabetes and Vitamin D Deficiency.   Patient has a very remote hx/o ulcertative colitis and last Colonoscopy  in 2008 was Negative.       Patient is treated for HTN (2003) & BP has been controlled at home. Today's BP is at goal -116/76. Patient has had no complaints of any cardiac type chest pain, palpitations, dyspnea / orthopnea / PND, dizziness, claudication, or dependent edema.      Hyperlipidemia is controlled with diet & meds. Patient denies myalgias or other med SE's. Last Lipids were at goal:  Lab Results  Component Value Date   CHOL 133 12/13/2018   HDL 52 12/13/2018   LDLCALC 59 12/13/2018   TRIG 139 12/13/2018   CHOLHDL 2.6 12/13/2018        Also, the patient has been followed expectantly for glucose intolerance & PreDiabetes and has had no symptoms of reactive hypoglycemia, diabetic polys, paresthesias or visual blurring.  Last A1c was Normal & at goal: Lab Results  Component Value Date   HGBA1C 5.2 12/13/2018        Further, the patient also has history of Vitamin D Deficiency ("20" / 2008)  and supplements vitamin D without any suspected side-effects. Last vitamin D was slightly elevated & dose was decreased from 7 caps to 5 caps /week:  Lab Results  Component Value Date   VD25OH 117 (H) 12/13/2018    Current Outpatient Medications on File Prior to Visit  Medication Sig  . ALPRAZolam (XANAX) 1 MG tablet Take 1/2 to 1 tablet 2 to 3 x / day ONLY if needed for Anxiety Attack  & please try to limit to 5 days /week to avoid addiction  . atenolol (TENORMIN) 100 MG tablet Take 1 tablet every Morning for BP  . ezetimibe (ZETIA) 10 MG tablet Take 1 tablet every day for Cholesterol  . finasteride (PROSCAR) 5 MG tablet Take 1 tablet daily for prostate  . lisinopril (ZESTRIL) 20 MG tablet Take 1 tablet every Nite for BP  . rosuvastatin (CRESTOR) 20 MG tablet  Take 1 tablet Daily for Cholesterol  . tamsulosin (FLOMAX) 0.4 MG CAPS capsule Take 1 capsule every Morning for Prostate  . Vitamin D, Ergocalciferol, (DRISDOL) 50000 units CAPS capsule Take 1 capsule (50,000 Units total) by mouth daily.   No current facility-administered medications on file prior to visit.    No Known Allergies  PMHx:   Past Medical History:  Diagnosis Date  . Anxiety   . Colitis, ulcerative ([facility])   . Hyperlipidemia   . Hypertension   . Hypogonadism male   . Vitamin D deficiency    Immunization History  Administered Date(s) Administered  . PPD Test 08/29/2014, 09/04/2015, 10/07/2016, 11/03/2017, 12/13/2018  . Pneumococcal-Unspecified 06/05/2002  . Td 12/13/2018  . Tdap 07/06/2008   History reviewed. No pertinent surgical history.  FHx:    Reviewed / unchanged  SHx:    Reviewed / unchanged   Systems Review:  Constitutional: Denies fever, chills, wt changes, headaches, insomnia, fatigue, night sweats, change in appetite. Eyes: Denies redness, blurred vision, diplopia, discharge, itchy, watery eyes.  ENT: Denies discharge, congestion, post nasal drip, epistaxis, sore throat, earache, hearing loss, dental pain, tinnitus, vertigo, sinus pain, snoring.  CV: Denies chest pain, palpitations, irregular heartbeat, syncope, dyspnea, diaphoresis, orthopnea, PND, claudication or edema. Respiratory: denies cough, dyspnea, DOE,  pleurisy, hoarseness, laryngitis, wheezing.  Gastrointestinal: Denies dysphagia, odynophagia, heartburn, reflux, water brash, abdominal pain or cramps, nausea, vomiting, bloating, diarrhea, constipation, hematemesis, melena, hematochezia  or hemorrhoids. Genitourinary: Denies dysuria, frequency, urgency, nocturia, hesitancy, discharge, hematuria or flank pain. Musculoskeletal: Denies arthralgias, myalgias, stiffness, jt. swelling, pain, limping or strain/sprain.  Skin: Denies pruritus, rash, hives, warts, acne, eczema or change in skin  lesion(s). Neuro: No weakness, tremor, incoordination, spasms, paresthesia or pain. Psychiatric: Denies confusion, memory loss or sensory loss. Endo: Denies change in weight, skin or hair change.  Heme/Lymph: No excessive bleeding, bruising or enlarged lymph nodes.  Physical Exam  BP 116/76   Pulse 60   Temp (!) 97.5 F (36.4 C)   Resp 16   Ht 5\' 10"  (1.778 m)   Wt 227 lb (103 kg)   BMI 32.57 kg/m   Appears  well nourished, well groomed  and in no distress.  Eyes: PERRLA, EOMs, conjunctiva no swelling or erythema. Sinuses: No frontal/maxillary tenderness ENT/Mouth: EAC's clear, TM's nl w/o erythema, bulging. Nares clear w/o erythema, swelling, exudates. Oropharynx clear without erythema or exudates. Oral hygiene is good. Tongue normal, non obstructing. Hearing intact.  Neck: Supple. Thyroid not palpable. Car 2+/2+ without bruits, nodes or JVD. Chest: Respirations nl with BS clear & equal w/o rales, rhonchi, wheezing or stridor.  Cor: Heart sounds normal w/ regular rate and rhythm without sig. murmurs, gallops, clicks or rubs. Peripheral pulses normal and equal  without edema.  Abdomen: Soft & bowel sounds normal. Non-tender w/o guarding, rebound, hernias, masses or organomegaly.  Lymphatics: Unremarkable.  Musculoskeletal: Full ROM all peripheral extremities, joint stability, 5/5 strength and normal gait.  Skin: Warm, dry without exposed rashes, lesions or ecchymosis apparent.  Neuro: Cranial nerves intact, reflexes equal bilaterally. Sensory-motor testing grossly intact. Tendon reflexes grossly intact.  Pysch: Alert & oriented x 3.  Insight and judgement nl & appropriate. No ideations.  Assessment and Plan:  1. Essential hypertension  - Continue medication, monitor blood pressure at home.  - Continue DASH diet.  Reminder to go to the ER if any CP,  SOB, nausea, dizziness, severe HA, changes vision/speech.  - CBC with Differential/Platelet - COMPLETE METABOLIC PANEL WITH  GFR - Magnesium - TSH  2. Hyperlipidemia, mixed  - Continue diet/meds, exercise,& lifestyle modifications.  - Continue monitor periodic cholesterol/liver & renal functions   - Lipid panel - TSH  3. Abnormal glucose  - Continue diet, exercise  - Lifestyle modifications.  - Monitor appropriate labs.  - Hemoglobin A1c - Insulin, random  4. Vitamin D deficiency  - Continue supplementation.  - VITAMIN D 25 Hydroxy   5. Prediabetes  - Hemoglobin A1c - Insulin, random  6. Medication management  - CBC with Differential/Platelet - COMPLETE METABOLIC PANEL WITH GFR - Magnesium - Lipid panel - TSH - Hemoglobin A1c - Insulin, random - VITAMIN D 25 Hydroxy        Discussed  regular exercise, BP monitoring, weight control to achieve/maintain BMI less than 25 and discussed med and SE's. Recommended labs to assess and monitor clinical status with further disposition pending results of labs.  I discussed the assessment and treatment plan with the patient. The patient was provided an opportunity to ask questions and all were answered. The patient agreed with the plan and demonstrated an understanding of the instructions.  I provided over 30 minutes of exam, counseling, chart review and  complex critical decision making.  , MD

## 2019-07-31 NOTE — Patient Instructions (Signed)

## 2019-08-01 LAB — COMPLETE METABOLIC PANEL WITH GFR
AG Ratio: 2.1 (calc) (ref 1.0–2.5)
ALT: 29 U/L (ref 9–46)
AST: 19 U/L (ref 10–35)
Albumin: 4.7 g/dL (ref 3.6–5.1)
Alkaline phosphatase (APISO): 55 U/L (ref 35–144)
BUN: 13 mg/dL (ref 7–25)
CO2: 29 mmol/L (ref 20–32)
Calcium: 9.5 mg/dL (ref 8.6–10.3)
Chloride: 102 mmol/L (ref 98–110)
Creat: 0.71 mg/dL (ref 0.70–1.33)
GFR, Est African American: 123 mL/min/{1.73_m2} (ref >=60)
GFR, Est Non African American: 106 mL/min/{1.73_m2} (ref >=60)
Globulin: 2.2 g/dL (calc) (ref 1.9–3.7)
Glucose, Bld: 88 mg/dL (ref 65–99)
Potassium: 4.3 mmol/L (ref 3.5–5.3)
Sodium: 139 mmol/L (ref 135–146)
Total Bilirubin: 0.5 mg/dL (ref 0.2–1.2)
Total Protein: 6.9 g/dL (ref 6.1–8.1)

## 2019-08-01 LAB — CBC WITH DIFFERENTIAL/PLATELET
Absolute Monocytes: 778 cells/uL (ref 200–950)
Basophils Absolute: 141 cells/uL (ref 0–200)
Basophils Relative: 1.4 %
Eosinophils Absolute: 354 cells/uL (ref 15–500)
Eosinophils Relative: 3.5 %
HCT: 43.8 % (ref 38.5–50.0)
Hemoglobin: 14.8 g/dL (ref 13.2–17.1)
Lymphs Abs: 3000 cells/uL (ref 850–3900)
MCH: 30.8 pg (ref 27.0–33.0)
MCHC: 33.8 g/dL (ref 32.0–36.0)
MCV: 91.3 fL (ref 80.0–100.0)
MPV: 10.2 fL (ref 7.5–12.5)
Monocytes Relative: 7.7 %
Neutro Abs: 5828 cells/uL (ref 1500–7800)
Neutrophils Relative %: 57.7 %
Platelets: 250 10*3/uL (ref 140–400)
RBC: 4.8 10*6/uL (ref 4.20–5.80)
RDW: 12.3 % (ref 11.0–15.0)
Total Lymphocyte: 29.7 %
WBC: 10.1 10*3/uL (ref 3.8–10.8)

## 2019-08-01 LAB — LIPID PANEL
Cholesterol: 165 mg/dL (ref <200)
HDL: 40 mg/dL (ref >=40)
LDL Cholesterol (Calc): 88 mg/dL (calc)
Non-HDL Cholesterol (Calc): 125 mg/dL (calc) (ref <130)
Total CHOL/HDL Ratio: 4.1 (calc) (ref <5.0)
Triglycerides: 304 mg/dL — ABNORMAL HIGH (ref <150)

## 2019-08-01 LAB — HEMOGLOBIN A1C
Hgb A1c MFr Bld: 5.3 % of total Hgb (ref <5.7)
Mean Plasma Glucose: 105 (calc)
eAG (mmol/L): 5.8 (calc)

## 2019-08-01 LAB — INSULIN, RANDOM: Insulin: 6.2 u[IU]/mL

## 2019-08-01 LAB — TSH: TSH: 2.87 mIU/L (ref 0.40–4.50)

## 2019-08-01 LAB — VITAMIN D 25 HYDROXY (VIT D DEFICIENCY, FRACTURES): Vit D, 25-Hydroxy: 66 ng/mL (ref 30–100)

## 2019-08-01 LAB — MAGNESIUM: Magnesium: 2.3 mg/dL (ref 1.5–2.5)

## 2019-08-01 MED ORDER — CLOTRIMAZOLE-BETAMETHASONE 1-0.05 % EX CREA: TOPICAL_CREAM | CUTANEOUS | 1 refills | Status: DC

## 2019-08-01 MED ORDER — ALPRAZOLAM 1 MG PO TABS: ORAL_TABLET | ORAL | 0 refills | Status: DC

## 2019-08-03 ENCOUNTER — Encounter: Payer: Self-pay | Admitting: Internal Medicine

## 2019-11-05 ENCOUNTER — Ambulatory Visit: Payer: BC Managed Care – PPO | Admitting: Adult Health Nurse Practitioner

## 2019-12-31 ENCOUNTER — Encounter: Payer: Self-pay | Admitting: Internal Medicine

## 2019-12-31 NOTE — Progress Notes (Signed)
Annual  Screening/Preventative Visit  & Comprehensive Evaluation & Examination     This very nice 54 y.o.  MWM  presents for a Screening /Preventative Visit & comprehensive evaluation and management of multiple medical co-morbidities.  Patient has been followed for HTN, HLD, Prediabetes and Vitamin D Deficiency. Patient has remote hx/o Ulcerative Colitis in 2008 and has done well since     HTN predates since 2003. Patient's BP has been controlled at home.  Today's BP is at goal - 124/72. Patient denies any cardiac symptoms as chest pain, palpitations, shortness of breath, dizziness or ankle swelling.     Patient's hyperlipidemia is controlled with diet and medications. Patient denies myalgias or other medication SE's. Last lipids were at goal except elevated Trig's:  Lab Results  Component Value Date   CHOL 165 07/31/2019   HDL 40 07/31/2019   LDLCALC 88 07/31/2019   TRIG 304 (H) 07/31/2019   CHOLHDL 4.1 07/31/2019       Patient is monitored expectantly for glucose intolerance  and patient denies reactive hypoglycemic symptoms, visual blurring, diabetic polys or paresthesias. Last A1c was Normal & at goal:  Lab Results  Component Value Date   HGBA1C 5.3 07/31/2019        Finally, patient has history of Vitamin D Deficiency  ("20" / 2008)  and last vitamin D was at goal:  Lab Results  Component Value Date   VD25OH 66 07/31/2019    Current Outpatient Medications on File Prior to Visit  Medication Sig  . ALPRAZolam (XANAX) 1 MG tablet Take 1/2 to 1 tablet 2 to 3 x / day ONLY if needed for Anxiety Attack  & please try to limit to 5 days /week to avoid addiction  . atenolol (TENORMIN) 100 MG tablet Take 1 tablet every Morning for BP  . Cholecalciferol (VITAMIN D) 125 MCG (5000 UT) CAPS Take by mouth. Takes 2 capsules a week.  . clotrimazole-betamethasone (LOTRISONE) cream Apply to affected area 2-4 x / daily  . ezetimibe (ZETIA) 10 MG tablet Take 1 tablet every day for  Cholesterol  . finasteride (PROSCAR) 5 MG tablet Take 1 tablet daily for prostate  . lisinopril (ZESTRIL) 20 MG tablet Take 1 tablet every Nite for BP  . rosuvastatin (CRESTOR) 20 MG tablet Take 1 tablet Daily for Cholesterol  . tamsulosin (FLOMAX) 0.4 MG CAPS capsule Take 1 capsule every Morning for Prostate   No current facility-administered medications on file prior to visit.   No Known Allergies Past Medical History:  Diagnosis Date  . Anxiety   . Colitis, ulcerative (HCC)   . Hyperlipidemia   . Hypertension   . Hypogonadism male   . Vitamin D deficiency    Health Maintenance  Topic Date Due  . Hepatitis C Screening  Never done  . HIV Screening  Never done  . INFLUENZA VACCINE  02/02/2020  . COLONOSCOPY  05/12/2023  . TETANUS/TDAP  12/12/2028  . COVID-19 Vaccine  Completed   Immunization History  Administered Date(s) Administered  . PFIZER SARS-COV-2 Vaccination 09/30/2019, 10/21/2019  . PPD Test 08/29/2014, 09/04/2015, 10/07/2016, 11/03/2017, 12/13/2018, 01/01/2020  . Pneumococcal-Unspecified 06/05/2002  . Td 12/13/2018  . Tdap 07/06/2008   Last Colon - 04/2007 - fdr Edwards  Family History  Problem Relation Age of Onset  . Cancer Mother        ocular  . Thyroid disease Mother   . Hypertension Father   . Hyperlipidemia Father    Social History   Socioeconomic History  .  Marital status: Unknown    Spouse name: Not on file  . Number of children: Not on file  . Years of education: Not on file  . Highest education level: Not on file  Occupational History  . Works at [employer]  . Smoking status: Never Smoker  . Smokeless tobacco: Never Used  Substance and Sexual Activity  . Alcohol use: Yes    Alcohol/week: 0.0 standard drinks    Comment: occasionally  . Drug use: No  . Sexual activity: Yes   ROS Constitutional: Denies fever, chills, weight loss/gain, headaches, insomnia,  night sweats or change in appetite. Does c/o fatigue. Eyes:  Denies redness, blurred vision, diplopia, discharge, itchy or watery eyes.  ENT: Denies discharge, congestion, post nasal drip, epistaxis, sore throat, earache, hearing loss, dental pain, Tinnitus, Vertigo, Sinus pain or snoring.  Cardio: Denies chest pain, palpitations, irregular heartbeat, syncope, dyspnea, diaphoresis, orthopnea, PND, claudication or edema Respiratory: denies cough, dyspnea, DOE, pleurisy, hoarseness, laryngitis or wheezing.  Gastrointestinal: Denies dysphagia, heartburn, reflux, water brash, pain, cramps, nausea, vomiting, bloating, diarrhea, constipation, hematemesis, melena, hematochezia, jaundice or hemorrhoids Genitourinary: Denies dysuria, frequency, urgency, nocturia, hesitancy, discharge, hematuria or flank pain Musculoskeletal: Denies arthralgia, myalgia, stiffness, Jt. Swelling, pain, limp or strain/sprain. Denies Falls. Skin: Denies puritis, rash, hives, warts, acne, eczema or change in skin lesion Neuro: No weakness, tremor, incoordination, spasms, paresthesia or pain Psychiatric: Denies confusion, memory loss or sensory loss. Denies Depression. Endocrine: Denies change in weight, skin, hair change, nocturia, and paresthesia, diabetic polys, visual blurring or hyper / hypo glycemic episodes.  Heme/Lymph: No excessive bleeding, bruising or enlarged lymph nodes.  Physical Exam  BP 124/72   Pulse 80   Temp 97.9 F (36.6 C)   Resp 16   Ht 5' 9.5" (1.765 m)   Wt 216 lb 12.8 oz (98.3 kg)   BMI 31.56 kg/m   General Appearance: Well nourished and well groomed and in no apparent distress.  Eyes: PERRLA, EOMs, conjunctiva no swelling or erythema, normal fundi and vessels. Sinuses: No frontal/maxillary tenderness ENT/Mouth: EACs patent / TMs  nl. Nares clear without erythema, swelling, mucoid exudates. Oral hygiene is good. No erythema, swelling, or exudate. Tongue normal, non-obstructing. Tonsils not swollen or erythematous. Hearing normal.  Neck: Supple, thyroid  not palpable. No bruits, nodes or JVD. Respiratory: Respiratory effort normal.  BS equal and clear bilateral without rales, rhonci, wheezing or stridor. Cardio: Heart sounds are normal with regular rate and rhythm and no murmurs, rubs or gallops. Peripheral pulses are normal and equal bilaterally without edema. No aortic or femoral bruits. Chest: symmetric with normal excursions and percussion.  Abdomen: Soft, with Nl bowel sounds. Nontender, no guarding, rebound, hernias, masses, or organomegaly.  Lymphatics: Non tender without lymphadenopathy.  Musculoskeletal: Full ROM all peripheral extremities, joint stability, 5/5 strength, and normal gait. Skin: Warm and dry without rashes, lesions, cyanosis, clubbing or  ecchymosis.  Neuro: Cranial nerves intact, reflexes equal bilaterally. Normal muscle tone, no cerebellar symptoms. Sensation intact.  Pysch: Alert and oriented X 3 with normal affect, insight and judgment appropriate.   Assessment and Plan  1. Annual Preventative/Screening Exam    2. Essential hypertension  - EKG 12-Lead - [place], RETROPERITNL ABD,  LTD - Urinalysis, Routine w reflex microscopic - Microalbumin / creatinine urine ratio - CBC with Differential/Platelet - COMPLETE METABOLIC PANEL WITH GFR - Magnesium - TSH  3. Hyperlipidemia, mixed  - EKG 12-Lead - [place], RETROPERITNL ABD,  LTD - Lipid panel - TSH  4. Abnormal glucose  - EKG 12-Lead - [place], RETROPERITNL ABD,  LTD - Hemoglobin A1c - Insulin, random  5. Vitamin D deficiency  - VITAMIN D 25 Hydroxy  6. Screening for colorectal cancer  - POC Hemoccult Bld/Stl   7. Prostate cancer screening  - PSA  8. Screening examination for pulmonary tuberculosis  - TB Skin Test  9. Screening for ischemic heart disease  - EKG 12-Lead  10. FHx: heart disease  - EKG 12-Lead - [place], RETROPERITNL ABD,  LTD  11. Screening for AAA (aortic abdominal aneurysm)  - [place], RETROPERITNL ABD,  LTD  12. Fatigue,  unspecified type  - Iron,Total/Total Iron Binding Cap - Vitamin B12 - Testosterone - CBC with Differential/Platelet - TSH  13. Medication management  - Urinalysis, Routine w reflex microscopic - Microalbumin / creatinine urine ratio - CBC with Differential/Platelet - COMPLETE METABOLIC PANEL WITH GFR - Magnesium - Lipid panel - TSH - Hemoglobin A1c - Insulin, random - VITAMIN D 25 Hydroxy        Patient was counseled in prudent diet, weight control to achieve/maintain BMI less than 25, BP monitoring, regular exercise and medications as discussed.  Discussed med effects and SE's. Routine screening labs and tests as requested with regular follow-up as recommended. Over 40 minutes of exam, counseling, chart review and high complex critical decision making was performed   Marinus Maw, MD

## 2019-12-31 NOTE — Patient Instructions (Signed)

## 2020-01-01 ENCOUNTER — Other Ambulatory Visit: Payer: Self-pay

## 2020-01-01 ENCOUNTER — Ambulatory Visit (INDEPENDENT_AMBULATORY_CARE_PROVIDER_SITE_OTHER): Payer: BC Managed Care – PPO | Admitting: Internal Medicine

## 2020-01-01 ENCOUNTER — Encounter: Payer: Self-pay | Admitting: Internal Medicine

## 2020-01-01 VITALS — BP 124/72 | HR 80 | Temp 97.9°F | Resp 16 | Ht 69.5 in | Wt 216.8 lb

## 2020-01-01 DIAGNOSIS — N401 Enlarged prostate with lower urinary tract symptoms: Secondary | ICD-10-CM | POA: Diagnosis not present

## 2020-01-01 DIAGNOSIS — Z136 Encounter for screening for cardiovascular disorders: Secondary | ICD-10-CM | POA: Diagnosis not present

## 2020-01-01 DIAGNOSIS — Z0001 Encounter for general adult medical examination with abnormal findings: Secondary | ICD-10-CM

## 2020-01-01 DIAGNOSIS — R5383 Other fatigue: Secondary | ICD-10-CM

## 2020-01-01 DIAGNOSIS — Z1211 Encounter for screening for malignant neoplasm of colon: Secondary | ICD-10-CM

## 2020-01-01 DIAGNOSIS — E559 Vitamin D deficiency, unspecified: Secondary | ICD-10-CM | POA: Diagnosis not present

## 2020-01-01 DIAGNOSIS — Z1329 Encounter for screening for other suspected endocrine disorder: Secondary | ICD-10-CM

## 2020-01-01 DIAGNOSIS — E782 Mixed hyperlipidemia: Secondary | ICD-10-CM

## 2020-01-01 DIAGNOSIS — I10 Essential (primary) hypertension: Secondary | ICD-10-CM

## 2020-01-01 DIAGNOSIS — Z111 Encounter for screening for respiratory tuberculosis: Secondary | ICD-10-CM

## 2020-01-01 DIAGNOSIS — Z131 Encounter for screening for diabetes mellitus: Secondary | ICD-10-CM

## 2020-01-01 DIAGNOSIS — Z0000 Encounter for general adult medical examination without abnormal findings: Secondary | ICD-10-CM | POA: Diagnosis not present

## 2020-01-01 DIAGNOSIS — Z79899 Other long term (current) drug therapy: Secondary | ICD-10-CM

## 2020-01-01 DIAGNOSIS — R350 Frequency of micturition: Secondary | ICD-10-CM | POA: Diagnosis not present

## 2020-01-01 DIAGNOSIS — Z8249 Family history of ischemic heart disease and other diseases of the circulatory system: Secondary | ICD-10-CM

## 2020-01-01 DIAGNOSIS — Z13220 Encounter for screening for lipoid disorders: Secondary | ICD-10-CM | POA: Diagnosis not present

## 2020-01-01 DIAGNOSIS — Z125 Encounter for screening for malignant neoplasm of prostate: Secondary | ICD-10-CM

## 2020-01-01 DIAGNOSIS — Z130 Encounter for screening for diseases of the blood and blood-forming organs and certain disorders involving the immune mechanism: Secondary | ICD-10-CM | POA: Diagnosis not present

## 2020-01-01 DIAGNOSIS — R7309 Other abnormal glucose: Secondary | ICD-10-CM

## 2020-01-01 DIAGNOSIS — Z1389 Encounter for screening for other disorder: Secondary | ICD-10-CM

## 2020-01-02 LAB — TSH: TSH: 1.03 mIU/L (ref 0.40–4.50)

## 2020-01-02 LAB — COMPLETE METABOLIC PANEL WITH GFR
AG Ratio: 1.7 (calc) (ref 1.0–2.5)
ALT: 24 U/L (ref 9–46)
AST: 19 U/L (ref 10–35)
Albumin: 4.5 g/dL (ref 3.6–5.1)
Alkaline phosphatase (APISO): 66 U/L (ref 35–144)
BUN: 9 mg/dL (ref 7–25)
CO2: 27 mmol/L (ref 20–32)
Calcium: 9.8 mg/dL (ref 8.6–10.3)
Chloride: 101 mmol/L (ref 98–110)
Creat: 0.73 mg/dL (ref 0.70–1.33)
GFR, Est African American: 122 mL/min/{1.73_m2} (ref >=60)
GFR, Est Non African American: 105 mL/min/{1.73_m2} (ref >=60)
Globulin: 2.7 g/dL (calc) (ref 1.9–3.7)
Glucose, Bld: 92 mg/dL (ref 65–99)
Potassium: 4.2 mmol/L (ref 3.5–5.3)
Sodium: 138 mmol/L (ref 135–146)
Total Bilirubin: 0.6 mg/dL (ref 0.2–1.2)
Total Protein: 7.2 g/dL (ref 6.1–8.1)

## 2020-01-02 LAB — URINALYSIS, ROUTINE W REFLEX MICROSCOPIC
Bilirubin Urine: NEGATIVE
Glucose, UA: NEGATIVE
Hgb urine dipstick: NEGATIVE
Ketones, ur: NEGATIVE
Leukocytes,Ua: NEGATIVE
Nitrite: NEGATIVE
Protein, ur: NEGATIVE
Specific Gravity, Urine: 1.006 (ref 1.001–1.03)
pH: 5.5 (ref 5.0–8.0)

## 2020-01-02 LAB — VITAMIN B12: Vitamin B-12: 471 pg/mL (ref 200–1100)

## 2020-01-02 LAB — CBC WITH DIFFERENTIAL/PLATELET
Absolute Monocytes: 425 cells/uL (ref 200–950)
Basophils Absolute: 58 cells/uL (ref 0–200)
Basophils Relative: 0.8 %
Eosinophils Absolute: 223 cells/uL (ref 15–500)
Eosinophils Relative: 3.1 %
HCT: 42.5 % (ref 38.5–50.0)
Hemoglobin: 14.4 g/dL (ref 13.2–17.1)
Lymphs Abs: 1058 cells/uL (ref 850–3900)
MCH: 31.8 pg (ref 27.0–33.0)
MCHC: 33.9 g/dL (ref 32.0–36.0)
MCV: 93.8 fL (ref 80.0–100.0)
MPV: 9.3 fL (ref 7.5–12.5)
Monocytes Relative: 5.9 %
Neutro Abs: 5436 cells/uL (ref 1500–7800)
Neutrophils Relative %: 75.5 %
Platelets: 296 10*3/uL (ref 140–400)
RBC: 4.53 10*6/uL (ref 4.20–5.80)
RDW: 12.1 % (ref 11.0–15.0)
Total Lymphocyte: 14.7 %
WBC: 7.2 10*3/uL (ref 3.8–10.8)

## 2020-01-02 LAB — MICROALBUMIN / CREATININE URINE RATIO
Creatinine, Urine: 48 mg/dL (ref 20–320)
Microalb, Ur: <0.2 mg/dL

## 2020-01-02 LAB — IRON, TOTAL/TOTAL IRON BINDING CAP
%SAT: 10 % (calc) — ABNORMAL LOW (ref 20–48)
Iron: 36 ug/dL — ABNORMAL LOW (ref 50–180)
TIBC: 350 mcg/dL (calc) (ref 250–425)

## 2020-01-02 LAB — TESTOSTERONE: Testosterone: 197 ng/dL — ABNORMAL LOW (ref 250–827)

## 2020-01-02 LAB — PSA: PSA: 0.6 ng/mL (ref <=4.0)

## 2020-01-02 LAB — INSULIN, RANDOM: Insulin: 7.5 u[IU]/mL

## 2020-01-02 LAB — LIPID PANEL
Cholesterol: 164 mg/dL (ref <200)
HDL: 43 mg/dL (ref >=40)
LDL Cholesterol (Calc): 87 mg/dL (calc)
Non-HDL Cholesterol (Calc): 121 mg/dL (calc) (ref <130)
Total CHOL/HDL Ratio: 3.8 (calc) (ref <5.0)
Triglycerides: 245 mg/dL — ABNORMAL HIGH (ref <150)

## 2020-01-02 LAB — HEMOGLOBIN A1C
Hgb A1c MFr Bld: 5.3 % of total Hgb (ref <5.7)
Mean Plasma Glucose: 105 (calc)
eAG (mmol/L): 5.8 (calc)

## 2020-01-02 LAB — MAGNESIUM: Magnesium: 2.2 mg/dL (ref 1.5–2.5)

## 2020-01-02 LAB — VITAMIN D 25 HYDROXY (VIT D DEFICIENCY, FRACTURES): Vit D, 25-Hydroxy: 50 ng/mL (ref 30–100)

## 2020-01-02 NOTE — Progress Notes (Signed)
=========================================================  -    Iron is Low - Recommend take an OTC iron supplement =========================================================  - Vitamin B12 - is Normal range & OK  =========================================================  -  PSA - Low - Great  =========================================================  -  Testosterone is very low - Recommend take Zinc 50 mg tab Dailuhy to help raise Testosterone naturally- Losing weight will also help tremendously ! =========================================================  -  Total Chol =  164    and LDL Chol =   87  - Both Excellent   - Very low risk for Heart Attack  / Stroke  -  but.................... Triglycerides (   245  ) or fats in blood are too high  (goal is less than 150)    - Recommend avoid fried & greasy foods,  sweets / candy,   - Avoid white rice  (brown or wild rice or Quinoa is OK),   - Avoid white potatoes  (sweet potatoes are OK)   - Avoid anything made from white flour  - bagels, doughnuts, rolls, buns, biscuits, white and   wheat breads, pizza crust and traditional  pasta made of white flour & egg white  - (vegetarian pasta or spinach or wheat pasta is OK).    - Multi-grain bread is OK - like multi-grain flat bread or  sandwich thins.   - Avoid alcohol in excess.   - Exercise is also important. =========================================================  -  A1c = 5.3% - Great - No Diabetes =========================================================  -  Vitamin D = 50 - slightly Low   - Vitamin D goal is between 70-100.   - Please make sure that you are taking your Vitamin D 10,000 units /Daily as directed.   - It is very important as a natural anti-inflammatory and helping the  immune system protect against viral infections, like the Covid-19    helping hair, skin, and nails, as well as reducing stroke and heart attack risk.   - It helps your bones and helps  with mood.  - It also decreases numerous cancer risks so please take it as directed.   - Low Vit D is associated with a 200-300% higher risk for CANCER   and 200-300% higher risk for HEART   ATTACK  &  STROKE.    - It is also associated with higher death rate at younger ages,   autoimmune diseases like Rheumatoid arthritis, Lupus, Multiple Sclerosis.     - Also many other serious conditions, like depression, Alzheimer's  Dementia, infertility, muscle aches, fatigue, fibromyalgia - just to name a few.  ===================================================  -  All Else - CBC - Kidneys - Electrolytes -  Liver - Magnesium & Thyroid  - all  Normal / OK =========================================================

## 2020-01-15 LAB — TB SKIN TEST
Induration: 0 mm
TB Skin Test: NEGATIVE

## 2020-02-17 ENCOUNTER — Other Ambulatory Visit: Payer: Self-pay | Admitting: Internal Medicine

## 2020-02-17 DIAGNOSIS — N401 Enlarged prostate with lower urinary tract symptoms: Secondary | ICD-10-CM

## 2020-04-08 ENCOUNTER — Ambulatory Visit: Payer: BC Managed Care – PPO | Admitting: Adult Health

## 2020-07-13 ENCOUNTER — Ambulatory Visit: Payer: BC Managed Care – PPO | Admitting: Internal Medicine

## 2020-08-03 ENCOUNTER — Ambulatory Visit: Payer: BC Managed Care – PPO | Admitting: Internal Medicine

## 2020-11-03 ENCOUNTER — Ambulatory Visit: Payer: BC Managed Care – PPO | Admitting: Internal Medicine

## 2020-12-29 DIAGNOSIS — I10 Essential (primary) hypertension: Secondary | ICD-10-CM | POA: Diagnosis not present

## 2020-12-29 DIAGNOSIS — Z20822 Contact with and (suspected) exposure to covid-19: Secondary | ICD-10-CM | POA: Diagnosis not present

## 2020-12-29 DIAGNOSIS — J00 Acute nasopharyngitis [common cold]: Secondary | ICD-10-CM | POA: Diagnosis not present

## 2020-12-29 DIAGNOSIS — Z03818 Encounter for observation for suspected exposure to other biological agents ruled out: Secondary | ICD-10-CM | POA: Diagnosis not present

## 2021-01-05 ENCOUNTER — Encounter: Payer: BC Managed Care – PPO | Admitting: Internal Medicine

## 2021-01-25 ENCOUNTER — Other Ambulatory Visit: Payer: Self-pay

## 2021-01-25 ENCOUNTER — Encounter: Payer: Self-pay | Admitting: Internal Medicine

## 2021-01-25 ENCOUNTER — Ambulatory Visit (INDEPENDENT_AMBULATORY_CARE_PROVIDER_SITE_OTHER): Payer: BC Managed Care – PPO | Admitting: Internal Medicine

## 2021-01-25 VITALS — BP 132/80 | HR 68 | Temp 97.8°F | Resp 17 | Ht 69.0 in | Wt 225.0 lb

## 2021-01-25 DIAGNOSIS — Z125 Encounter for screening for malignant neoplasm of prostate: Secondary | ICD-10-CM | POA: Diagnosis not present

## 2021-01-25 DIAGNOSIS — Z0000 Encounter for general adult medical examination without abnormal findings: Secondary | ICD-10-CM

## 2021-01-25 DIAGNOSIS — Z131 Encounter for screening for diabetes mellitus: Secondary | ICD-10-CM | POA: Diagnosis not present

## 2021-01-25 DIAGNOSIS — E782 Mixed hyperlipidemia: Secondary | ICD-10-CM

## 2021-01-25 DIAGNOSIS — Z1329 Encounter for screening for other suspected endocrine disorder: Secondary | ICD-10-CM

## 2021-01-25 DIAGNOSIS — Z1389 Encounter for screening for other disorder: Secondary | ICD-10-CM | POA: Diagnosis not present

## 2021-01-25 DIAGNOSIS — Z111 Encounter for screening for respiratory tuberculosis: Secondary | ICD-10-CM | POA: Diagnosis not present

## 2021-01-25 DIAGNOSIS — Z8249 Family history of ischemic heart disease and other diseases of the circulatory system: Secondary | ICD-10-CM

## 2021-01-25 DIAGNOSIS — Z79899 Other long term (current) drug therapy: Secondary | ICD-10-CM | POA: Diagnosis not present

## 2021-01-25 DIAGNOSIS — M1000 Idiopathic gout, unspecified site: Secondary | ICD-10-CM

## 2021-01-25 DIAGNOSIS — Z1212 Encounter for screening for malignant neoplasm of rectum: Secondary | ICD-10-CM

## 2021-01-25 DIAGNOSIS — R350 Frequency of micturition: Secondary | ICD-10-CM | POA: Diagnosis not present

## 2021-01-25 DIAGNOSIS — E559 Vitamin D deficiency, unspecified: Secondary | ICD-10-CM | POA: Diagnosis not present

## 2021-01-25 DIAGNOSIS — I10 Essential (primary) hypertension: Secondary | ICD-10-CM

## 2021-01-25 DIAGNOSIS — Z0001 Encounter for general adult medical examination with abnormal findings: Secondary | ICD-10-CM

## 2021-01-25 DIAGNOSIS — Z136 Encounter for screening for cardiovascular disorders: Secondary | ICD-10-CM

## 2021-01-25 DIAGNOSIS — Z130 Encounter for screening for diseases of the blood and blood-forming organs and certain disorders involving the immune mechanism: Secondary | ICD-10-CM

## 2021-01-25 DIAGNOSIS — N401 Enlarged prostate with lower urinary tract symptoms: Secondary | ICD-10-CM

## 2021-01-25 DIAGNOSIS — Z13220 Encounter for screening for lipoid disorders: Secondary | ICD-10-CM | POA: Diagnosis not present

## 2021-01-25 DIAGNOSIS — E349 Endocrine disorder, unspecified: Secondary | ICD-10-CM

## 2021-01-25 DIAGNOSIS — R5383 Other fatigue: Secondary | ICD-10-CM

## 2021-01-25 DIAGNOSIS — R7309 Other abnormal glucose: Secondary | ICD-10-CM

## 2021-01-25 DIAGNOSIS — Z1211 Encounter for screening for malignant neoplasm of colon: Secondary | ICD-10-CM

## 2021-01-25 MED ORDER — LISINOPRIL 20 MG PO TABS: ORAL_TABLET | ORAL | 3 refills | Status: DC

## 2021-01-25 MED ORDER — EZETIMIBE 10 MG PO TABS: ORAL_TABLET | ORAL | 3 refills | Status: DC

## 2021-01-25 MED ORDER — ROSUVASTATIN CALCIUM 20 MG PO TABS: ORAL_TABLET | ORAL | 3 refills | Status: DC

## 2021-01-25 MED ORDER — FINASTERIDE 5 MG PO TABS: ORAL_TABLET | ORAL | 3 refills | Status: DC

## 2021-01-25 MED ORDER — ALPRAZOLAM 1 MG PO TABS: ORAL_TABLET | ORAL | 0 refills | Status: DC

## 2021-01-25 MED ORDER — ATENOLOL 100 MG PO TABS: ORAL_TABLET | ORAL | 3 refills | Status: DC

## 2021-01-25 NOTE — Patient Instructions (Signed)

## 2021-01-25 NOTE — Progress Notes (Signed)
Annual  Screening/Preventative Visit  & Comprehensive Evaluation & Examination  Future Appointments  Date Time Provider Department Center  01/25/2022  3:00 PM Lucky Cowboy, MD [facility] None            This very nice 55 y.o. MWM presents for a Screening /Preventative Visit & comprehensive evaluation and management of multiple medical co-morbidities.  Patient has been followed for HTN, HLD, Prediabetes and Vitamin D Deficiency.       HTN predates  circa 2003. Patient's BP has been controlled at home.  Today's BP is at goal - 132/80. Patient denies any cardiac symptoms as chest pain, palpitations, shortness of breath, dizziness or ankle swelling.       Patient's hyperlipidemia is controlled with diet and medications. Patient denies myalgias or other medication SE's. Last lipids were at goal except elevated Trig's:  Lab Results  Component Value Date   CHOL 164 01/01/2020   HDL 43 01/01/2020   LDLCALC 87 01/01/2020   TRIG 245 (H) 01/01/2020   CHOLHDL 3.8 01/01/2020         Patient has hx/o prediabetes since    and patient denies reactive hypoglycemic symptoms, visual blurring, diabetic polys or paresthesias. Last A1c was normal & at goal:   Lab Results  Component Value Date   HGBA1C 5.3 01/01/2020          Finally, patient has history of Vitamin D Deficiency of    and last vitamin D was at goal:   Lab Results  Component Value Date   VD25OH 69 01/01/2020    Current Outpatient Medications on File Prior to Visit  Medication Sig   ALPRAZolam 1 MG tablet Take    1/2 - 1 tablet     2 - 3 x /day     ONLY if needed    atenolol  100 MG tablet Take 1 tablet every Morning    VITAMIN D  5000 u Takes 2 capsules a week.   ezetimibe  10 MG tablet Take 1 tablet every day    finasteride 5 MG tablet TAKE 1 TABLET DAILY   lisinopril 20 MG tablet Take 1 tablet every Nite    rosuvastatin  20 MG tablet Take 1 tablet Daily    tamsulosin 0.4 MG CAPS Take 1 capsule    at Bedtime      No Known Allergies   Past Medical History:  Diagnosis Date   Anxiety    Colitis, ulcerative (HCC)    Hyperlipidemia    Hypertension    Hypogonadism male    Vitamin D deficiency      Health Maintenance  Topic Date Due   HIV Screening  Never done   Hepatitis C Screening  Never done   Zoster Vaccines- Shingrix (1 of 2) Never done   Pneumococcal Vaccine -23 06/06/2003   COVID-19 Vaccine (3 - Pfizer risk series) 11/18/2019   INFLUENZA VACCINE  02/01/2021   COLONOSCOPY  05/12/2023   TETANUS/TDAP  12/12/2028   HPV VACCINES  Aged Out     Immunization History  Administered Date(s) Administered   PFIZER SARS-COV-2 Vacc 09/30/2019, 10/21/2019   PPD Test 08/29/2014, 09/04/2015, 10/07/2016, 11/03/2017, 12/13/2018, 01/01/2020   Pneumococcal-23 06/05/2002   Td 12/13/2018   Tdap 07/06/2008    Last Colon - 04/2007 - Dr Randa Evens  No past surgical history on file.   Family History  Problem Relation Age of Onset   Cancer Mother        ocular   Thyroid  disease Mother    Hypertension Father    Hyperlipidemia Father     Social History   Socioeconomic History   Marital status: Married    Spouse name: Not on file   Number of children: None  Occupational History   Not on file  Tobacco Use   Smoking status: Never   Smokeless tobacco: Never  Substance and Sexual Activity   Alcohol use: Yes    Alcohol/week: 0.0 standard drinks    Comment: occasionally    ROS Constitutional: Denies fever, chills, weight loss/gain, headaches, insomnia,  night sweats or change in appetite. Does c/o fatigue. Eyes: Denies redness, blurred vision, diplopia, discharge, itchy or watery eyes.  ENT: Denies discharge, congestion, post nasal drip, epistaxis, sore throat, earache, hearing loss, dental pain, Tinnitus, Vertigo, Sinus pain or snoring.  Cardio: Denies chest pain, palpitations, irregular heartbeat, syncope, dyspnea, diaphoresis, orthopnea, PND, claudication or edema Respiratory: denies  cough, dyspnea, DOE, pleurisy, hoarseness, laryngitis or wheezing.  Gastrointestinal: Denies dysphagia, heartburn, reflux, water brash, pain, cramps, nausea, vomiting, bloating, diarrhea, constipation, hematemesis, melena, hematochezia, jaundice or hemorrhoids Genitourinary: Denies dysuria, frequency, urgency, nocturia, hesitancy, discharge, hematuria or flank pain Musculoskeletal: Denies arthralgia, myalgia, stiffness, Jt. Swelling, pain, limp or strain/sprain. Denies Falls. Skin: Denies puritis, rash, hives, warts, acne, eczema or change in skin lesion Neuro: No weakness, tremor, incoordination, spasms, paresthesia or pain Psychiatric: Denies confusion, memory loss or sensory loss. Denies Depression. Endocrine: Denies change in weight, skin, hair change, nocturia, and paresthesia, diabetic polys, visual blurring or hyper / hypo glycemic episodes.  Heme/Lymph: No excessive bleeding, bruising or enlarged lymph nodes.   Physical Exam  BP 132/80   Pulse 68   Temp 97.8 F (36.6 C)   Resp 17   Ht 5\' 9"  (1.753 m)   Wt 225 lb (102.1 kg)   SpO2 98%   BMI 33.23 kg/m   General Appearance: Well nourished and well groomed and in no apparent distress.  Eyes: PERRLA, EOMs, conjunctiva no swelling or erythema, normal fundi and vessels. Sinuses: No frontal/maxillary tenderness ENT/Mouth: EACs patent / TMs  nl. Nares clear without erythema, swelling, mucoid exudates. Oral hygiene is good. No erythema, swelling, or exudate. Tongue normal, non-obstructing. Tonsils not swollen or erythematous. Hearing normal.  Neck: Supple, thyroid not palpable. No bruits, nodes or JVD. Respiratory: Respiratory effort normal.  BS equal and clear bilateral without rales, rhonci, wheezing or stridor. Cardio: Heart sounds are normal with regular rate and rhythm and no murmurs, rubs or gallops. Peripheral pulses are normal and equal bilaterally without edema. No aortic or femoral bruits. Chest: symmetric with normal  excursions and percussion.  Abdomen: Soft, with Nl bowel sounds. Nontender, no guarding, rebound, hernias, masses, or organomegaly.  Lymphatics: Non tender without lymphadenopathy.  Musculoskeletal: Full ROM all peripheral extremities, joint stability, 5/5 strength, and normal gait. Skin: Warm and dry without rashes, lesions, cyanosis, clubbing or  ecchymosis.  Neuro: Cranial nerves intact, reflexes equal bilaterally. Normal muscle tone, no cerebellar symptoms. Sensation intact.  Pysch: Alert and oriented x 3 with normal affect, insight and judgment appropriate.   Assessment and Plan  1. Annual Preventative/Screening Exam    2. Essential hypertension  - EKG 12-Lead - , RETROPERITNL ABD,  LTD - Urinalysis, Routine w reflex microscopic - Microalbumin / creatinine urine ratio - CBC with Differential/Platelet - COMPLETE METABOLIC PANEL WITH GFR - Magnesium - TSH  - lisinopril (ZESTRIL) 20 MG tablet; Take 1 tablet every Nite for BP  Dispense: 90 tablet; Refill: 3 - atenolol (  TENORMIN) 100 MG tablet; Take 1 tablet every Morning for BP  Dispense: 90 tablet; Refill: 3  3. Hyperlipidemia, mixed  - EKG 12-Lead - [place], RETROPERITNL ABD,  LTD - Lipid panel - TSH  - ezetimibe (ZETIA) 10 MG tablet; Take 1 tablet every day for Cholesterol  Dispense: 90 tablet; Refill: 3 - rosuvastatin (CRESTOR) 20 MG tablet; Take 1 tablet Daily for Cholesterol  Dispense: 90 tablet; Refill: 3  4. Abnormal glucose  - EKG 12-Lead - [place], RETROPERITNL ABD,  LTD - Hemoglobin A1c - Insulin, random  5. Vitamin D deficiency  - VITAMIN D 25 Hydroxy 6. Idiopathic gout  - Uric acid  7. Testosterone deficiency  - Testosterone  8. Screening for colorectal cancer  - POC Hemoccult Bld/Stl  9. Prostate cancer screening  - PSA  10. Screening examination for pulmonary tuberculosis  - TB Skin Test  11. Screening for ischemic heart disease  - EKG 12-Lead  12. FHx: heart disease  - EKG 12-Lead -  [place], RETROPERITNL ABD,  LTD  13. Screening for AAA (aortic abdominal aneurysm)  - [place], RETROPERITNL ABD,  LTD  14. Fatigue  - Iron, Total/Total Iron Binding Cap - Vitamin B12 - Testosterone  15. Medication management  - Urinalysis, Routine w reflex microscopic - Microalbumin / creatinine urine ratio  16. Benign localized prostatic hyperplasia with lower urinary tract symptoms (LUTS)  - finasteride (PROSCAR) 5 MG tablet; Take 1 tablet Daily for Prostate  Dispense: 90 tablet; Refill: 3           Patient was counseled in prudent diet, weight control to achieve/maintain BMI less than 25, BP monitoring, regular exercise and medications as discussed.  Discussed med effects and SE's. Routine screening labs and tests as requested with regular follow-up as recommended. Over 40 minutes of exam, counseling, chart review and high complex critical decision making was performed   Marinus Maw, MD

## 2021-01-26 LAB — VITAMIN B12: Vitamin B-12: 426 pg/mL (ref 200–1100)

## 2021-01-26 LAB — IRON, TOTAL/TOTAL IRON BINDING CAP
%SAT: 19 % (calc) — ABNORMAL LOW (ref 20–48)
Iron: 62 ug/dL (ref 50–180)
TIBC: 335 mcg/dL (calc) (ref 250–425)

## 2021-01-26 LAB — URINALYSIS, ROUTINE W REFLEX MICROSCOPIC
Bilirubin Urine: NEGATIVE
Glucose, UA: NEGATIVE
Hgb urine dipstick: NEGATIVE
Ketones, ur: NEGATIVE
Leukocytes,Ua: NEGATIVE
Nitrite: NEGATIVE
Protein, ur: NEGATIVE
Specific Gravity, Urine: 1.016 (ref 1.001–1.035)
pH: 5.5 (ref 5.0–8.0)

## 2021-01-26 LAB — CBC WITH DIFFERENTIAL/PLATELET
Absolute Monocytes: 686 cells/uL (ref 200–950)
Basophils Absolute: 79 cells/uL (ref 0–200)
Basophils Relative: 0.9 %
Eosinophils Absolute: 290 cells/uL (ref 15–500)
Eosinophils Relative: 3.3 %
HCT: 42.3 % (ref 38.5–50.0)
Hemoglobin: 14.4 g/dL (ref 13.2–17.1)
Lymphs Abs: 2640 cells/uL (ref 850–3900)
MCH: 31.5 pg (ref 27.0–33.0)
MCHC: 34 g/dL (ref 32.0–36.0)
MCV: 92.6 fL (ref 80.0–100.0)
MPV: 9.6 fL (ref 7.5–12.5)
Monocytes Relative: 7.8 %
Neutro Abs: 5104 cells/uL (ref 1500–7800)
Neutrophils Relative %: 58 %
Platelets: 283 10*3/uL (ref 140–400)
RBC: 4.57 10*6/uL (ref 4.20–5.80)
RDW: 12.2 % (ref 11.0–15.0)
Total Lymphocyte: 30 %
WBC: 8.8 10*3/uL (ref 3.8–10.8)

## 2021-01-26 LAB — TESTOSTERONE: Testosterone: 285 ng/dL (ref 250–827)

## 2021-01-26 LAB — URIC ACID: Uric Acid, Serum: 5.7 mg/dL (ref 4.0–8.0)

## 2021-01-26 LAB — INSULIN, RANDOM: Insulin: 9.2 u[IU]/mL

## 2021-01-26 LAB — COMPLETE METABOLIC PANEL WITH GFR
AG Ratio: 1.8 (calc) (ref 1.0–2.5)
ALT: 48 U/L — ABNORMAL HIGH (ref 9–46)
AST: 26 U/L (ref 10–35)
Albumin: 4.7 g/dL (ref 3.6–5.1)
Alkaline phosphatase (APISO): 61 U/L (ref 35–144)
BUN: 12 mg/dL (ref 7–25)
CO2: 31 mmol/L (ref 20–32)
Calcium: 10.1 mg/dL (ref 8.6–10.3)
Chloride: 101 mmol/L (ref 98–110)
Creat: 0.76 mg/dL (ref 0.70–1.30)
Globulin: 2.6 g/dL (calc) (ref 1.9–3.7)
Glucose, Bld: 88 mg/dL (ref 65–99)
Potassium: 4.4 mmol/L (ref 3.5–5.3)
Sodium: 141 mmol/L (ref 135–146)
Total Bilirubin: 0.7 mg/dL (ref 0.2–1.2)
Total Protein: 7.3 g/dL (ref 6.1–8.1)
eGFR: 106 mL/min/{1.73_m2} (ref >=60)

## 2021-01-26 LAB — LIPID PANEL
Cholesterol: 142 mg/dL (ref <200)
HDL: 44 mg/dL (ref >=40)
LDL Cholesterol (Calc): 74 mg/dL (calc)
Non-HDL Cholesterol (Calc): 98 mg/dL (calc) (ref <130)
Total CHOL/HDL Ratio: 3.2 (calc) (ref <5.0)
Triglycerides: 163 mg/dL — ABNORMAL HIGH (ref <150)

## 2021-01-26 LAB — HEMOGLOBIN A1C
Hgb A1c MFr Bld: 5.3 % of total Hgb (ref <5.7)
Mean Plasma Glucose: 105 mg/dL
eAG (mmol/L): 5.8 mmol/L

## 2021-01-26 LAB — MAGNESIUM: Magnesium: 2.3 mg/dL (ref 1.5–2.5)

## 2021-01-26 LAB — MICROALBUMIN / CREATININE URINE RATIO
Creatinine, Urine: 115 mg/dL (ref 20–320)
Microalb Creat Ratio: 2 mcg/mg creat (ref <30)
Microalb, Ur: 0.2 mg/dL

## 2021-01-26 LAB — TSH: TSH: 2.44 mIU/L (ref 0.40–4.50)

## 2021-01-26 LAB — VITAMIN D 25 HYDROXY (VIT D DEFICIENCY, FRACTURES): Vit D, 25-Hydroxy: 44 ng/mL (ref 30–100)

## 2021-01-26 LAB — PSA: PSA: 0.17 ng/mL (ref <=4.00)

## 2021-01-26 NOTE — Progress Notes (Signed)
============================================================ -   Test results slightly outside the reference range are not unusual. If there is anything important, I will review this with you,  otherwise it is considered normal test values.  If you have further questions,  please do not hesitate to contact me at the office or via My Chart.  ============================================================ ============================================================  -   Iron level - borderline low   - Recommend  eat more Veggies with Iron as Carrots, Beets , all leafy green veggies as Spinach, Collards,  Turnip - Mustard or Mixed Greens, Kale, Asparagus, Broccoli, Brussel Sprouts, Green Beans / peas, Soybeans, Lentils, Sweet Potatoes ============================================================ ============================================================  -   -  Vitamin B12 = 426     Very Low   (Ideal or Goal Vit B12 is between 450 - 1,100)   Low Vit B12 may be associated with Anemia , Fatigue,   Peripheral Neuropathy, Dementia, "Brain Fog", & Depression  - Recommend take a sub-lingual form of Vitamin B12 tablet   1,000 to 5,000 mcg tab that you dissolve under your tongue /Daily   - Can get Lavonia Dana - best price at [employer] or on [employer] ============================================================ ============================================================  -  PSA - Low - Great  ============================================================ ============================================================  -  Testosterone back in Normal range - Great  ! ============================================================ ============================================================  -  Uric Acid /Gout test is Normal & OK - Continue Allopurinol ============================================================ ============================================================  -  Total Chol = 142 and LDL  Chol = 74 - Both  Excellent   - Very low risk for Heart Attack  / Stroke ============================================================ ============================================================  -  A1c - Normal - great  - No Diabetes ============================================================ ============================================================  -  Vitamin D = 544 - Low   - Vitamin D goal is between 70-100.   - Please make sure that you are taking your Vitamin D 10,000 units / day  - It is very important as a natural anti-inflammatory and helping the  immune system protect against viral infections, like the Covid-19    helping hair, skin, and nails, as well as reducing stroke and  heart attack risk.   - It helps your bones and helps with mood.  - It also decreases numerous cancer risks so please  take it as directed.   - Low Vit D is associated with a 200-300% higher risk for  CANCER   and 200-300% higher risk for HEART   ATTACK  &  STROKE.    - It is also associated with higher death rate at younger ages,   autoimmune diseases like Rheumatoid arthritis, Lupus,  Multiple Sclerosis.     - Also many other serious conditions, like depression, Alzheimer's  Dementia, infertility, muscle aches, fatigue, fibromyalgia   - just to name a few. ============================================================ ============================================================  -  All Else - CBC - Kidneys - Electrolytes - Liver - Magnesium & Thyroid    - all  Normal / OK ============================================================ ============================================================

## 2021-02-03 LAB — TB SKIN TEST
Induration: 0 mm
TB Skin Test: NEGATIVE

## 2021-03-15 ENCOUNTER — Other Ambulatory Visit: Payer: Self-pay

## 2021-03-15 DIAGNOSIS — Z1211 Encounter for screening for malignant neoplasm of colon: Secondary | ICD-10-CM

## 2021-03-15 DIAGNOSIS — Z1212 Encounter for screening for malignant neoplasm of rectum: Secondary | ICD-10-CM

## 2021-03-15 LAB — POC HEMOCCULT BLD/STL (HOME/3-CARD/SCREEN)
Card #2 Fecal Occult Blod, POC: NEGATIVE
Card #3 Fecal Occult Blood, POC: NEGATIVE
Fecal Occult Blood, POC: NEGATIVE

## 2021-03-16 DIAGNOSIS — Z1212 Encounter for screening for malignant neoplasm of rectum: Secondary | ICD-10-CM | POA: Diagnosis not present

## 2021-03-16 DIAGNOSIS — Z1211 Encounter for screening for malignant neoplasm of colon: Secondary | ICD-10-CM | POA: Diagnosis not present

## 2021-05-05 ENCOUNTER — Other Ambulatory Visit: Payer: Self-pay | Admitting: Internal Medicine

## 2021-05-05 DIAGNOSIS — N401 Enlarged prostate with lower urinary tract symptoms: Secondary | ICD-10-CM

## 2021-05-06 ENCOUNTER — Other Ambulatory Visit: Payer: Self-pay | Admitting: Internal Medicine

## 2021-05-06 DIAGNOSIS — N401 Enlarged prostate with lower urinary tract symptoms: Secondary | ICD-10-CM

## 2021-07-29 ENCOUNTER — Ambulatory Visit: Payer: BC Managed Care – PPO | Admitting: Nurse Practitioner

## 2021-10-24 DIAGNOSIS — J0140 Acute pansinusitis, unspecified: Secondary | ICD-10-CM | POA: Diagnosis not present

## 2021-10-24 DIAGNOSIS — I10 Essential (primary) hypertension: Secondary | ICD-10-CM | POA: Diagnosis not present

## 2022-01-17 ENCOUNTER — Other Ambulatory Visit: Payer: Self-pay | Admitting: Internal Medicine

## 2022-01-24 NOTE — Patient Instructions (Signed)

## 2022-01-24 NOTE — Progress Notes (Unsigned)
Annual  Screening/Preventative Visit  & Comprehensive Evaluation & Examination  Future Appointments  Date Time Provider Department  01/25/2022  3:00 PM Lucky Cowboy, MD [facility]  01/30/2023  3:00 PM Lucky Cowboy, MD [facility]            [name] presents for a Screening /Preventative Visit & comprehensive evaluation and management of multiple medical co-morbidities.  Patient has been followed for HTN, HLD, Prediabetes and Vitamin D Deficiency.       HTN predates  since 2003. Patient's BP has been controlled at home.  Today's BP is  at goal -  133/87 .   Patient denies any cardiac symptoms as chest pain, palpitations, shortness of breath, dizziness or ankle swelling.       Patient's hyperlipidemia is controlled with diet and Rosuvastatin /Ezetimibe. Patient denies myalgias or other medication SE's. Last lipids were at goal except elevated Trig's:  Lab Results  Component Value Date   CHOL 164 01/01/2020   HDL 43 01/01/2020   LDLCALC 87 01/01/2020   TRIG 245 (H) 01/01/2020   CHOLHDL 3.8 01/01/2020         Patient is monitored proactively  for glucose intolerance  and patient denies reactive hypoglycemic symptoms, visual blurring, diabetic polys or paresthesias. Last A1c was normal & at goal:   Lab Results  Component Value Date   HGBA1C 5.3 01/01/2020          Finally, patient has history of Vitamin D Deficiency ("20" /2008) and last vitamin D was at goal:   Lab Results  Component Value Date   VD25OH 50 01/01/2020    Current Outpatient Medications on File Prior to Visit  Medication Sig   ALPRAZolam 1 MG tablet Take    1/2 - 1 tablet     2 - 3 x /day     ONLY if needed    atenolol  100 MG tablet Take 1 tablet every Morning    VITAMIN D  5000 u Takes 2 capsules a week.   ezetimibe  10 MG tablet Take 1 tablet every day    finasteride 5 MG tablet TAKE 1 TABLET DAILY   lisinopril 20 MG tablet Take 1 tablet every Nite    rosuvastatin  20 MG  tablet Take 1 tablet Daily    tamsulosin 0.4 MG CAPS Take 1 capsule    at Bedtime     No Known Allergies   Past Medical History:  Diagnosis Date   Anxiety    Colitis, ulcerative (HCC)    Hyperlipidemia    Hypertension    Hypogonadism male    Vitamin D deficiency      Health Maintenance  Topic Date Due   HIV Screening  Never done   Hepatitis C Screening  Never done   Zoster Vaccines- Shingrix (1 of 2) Never done   Pneumococcal Vaccine -23 06/06/2003   COVID-19 Vaccine (3 - Pfizer risk series) 11/18/2019   INFLUENZA VACCINE  02/01/2021   COLONOSCOPY  05/12/2023   TETANUS/TDAP  12/12/2028   HPV VACCINES  Aged Out     Immunization History  Administered Date(s) Administered   PFIZER SARS-COV-2 Vacc 09/30/2019, 10/21/2019   PPD Test 08/29/2014, 09/04/2015, 10/07/2016, 11/03/2017, 12/13/2018, 01/01/2020   Pneumococcal-23 06/05/2002   Td 12/13/2018   Tdap 07/06/2008    Last Colon - Nov 2019  - Dr Randa Evens - recc 5 year follow-up due Nov 2024  History reviewed. No pertinent surgical history.   Family  History  Problem Relation Age of Onset   Cancer Mother        ocular   Thyroid disease Mother    Hypertension Father    Hyperlipidemia Father     Social History   Socioeconomic History   Marital status: Married    Spouse name: Not on file   Number of children: None  Occupational History   Not on file  Tobacco Use   Smoking status: Never   Smokeless tobacco: Never  Substance and Sexual Activity   Alcohol use: Yes    Alcohol/week: 0.0 standard drinks    Comment: occasionally    ROS Constitutional: Denies fever, chills, weight loss/gain, headaches, insomnia,  night sweats or change in appetite. Does c/o fatigue. Eyes: Denies redness, blurred vision, diplopia, discharge, itchy or watery eyes.  ENT: Denies discharge, congestion, post nasal drip, epistaxis, sore throat, earache, hearing loss, dental pain, Tinnitus, Vertigo, Sinus pain or snoring.  Cardio: Denies  chest pain, palpitations, irregular heartbeat, syncope, dyspnea, diaphoresis, orthopnea, PND, claudication or edema Respiratory: denies cough, dyspnea, DOE, pleurisy, hoarseness, laryngitis or wheezing.  Gastrointestinal: Denies dysphagia, heartburn, reflux, water brash, pain, cramps, nausea, vomiting, bloating, diarrhea, constipation, hematemesis, melena, hematochezia, jaundice or hemorrhoids Genitourinary: Denies dysuria, frequency, urgency, nocturia, hesitancy, discharge, hematuria or flank pain Musculoskeletal: Denies arthralgia, myalgia, stiffness, Jt. Swelling, pain, limp or strain/sprain. Denies Falls. Skin: Denies puritis, rash, hives, warts, acne, eczema or change in skin lesion Neuro: No weakness, tremor, incoordination, spasms, paresthesia or pain Psychiatric: Denies confusion, memory loss or sensory loss. Denies Depression. Endocrine: Denies change in weight, skin, hair change, nocturia, and paresthesia, diabetic polys, visual blurring or hyper / hypo glycemic episodes.  Heme/Lymph: No excessive bleeding, bruising or enlarged lymph nodes.   Physical Exam  BP 133/87   Pulse (!) 57   Temp (!) 96.4 F (35.8 C)   Resp 16   Ht 5\' 9"  (1.753 m)   Wt 232 lb 12.8 oz (105.6 kg)   SpO2 98%   BMI 34.38 kg/m   General Appearance: Well nourished and well groomed and in no apparent distress.  Eyes: PERRLA, EOMs, conjunctiva no swelling or erythema, normal fundi and vessels. Sinuses: No frontal/maxillary tenderness ENT/Mouth: EACs patent / TMs  nl. Nares clear without erythema, swelling, mucoid exudates. Oral hygiene is good. No erythema, swelling, or exudate. Tongue normal, non-obstructing. Tonsils not swollen or erythematous. Hearing normal.  Neck: Supple, thyroid not palpable. No bruits, nodes or JVD. Respiratory: Respiratory effort normal.  BS equal and clear bilateral without rales, rhonci, wheezing or stridor. Cardio: Heart sounds are normal with regular rate and rhythm and no  murmurs, rubs or gallops. Peripheral pulses are normal and equal bilaterally without edema. No aortic or femoral bruits. Chest: symmetric with normal excursions and percussion.  Abdomen: Soft, with Nl bowel sounds. Nontender, no guarding, rebound, hernias, masses, or organomegaly.  Lymphatics: Non tender without lymphadenopathy.  Musculoskeletal: Full ROM all peripheral extremities, joint stability, 5/5 strength, and normal gait. Skin: Warm and dry without rashes, lesions, cyanosis, clubbing or  ecchymosis.  Neuro: Cranial nerves intact, reflexes equal bilaterally. Normal muscle tone, no cerebellar symptoms. Sensation intact.  Pysch: Alert and oriented x 3 with normal affect, insight and judgment appropriate.   Assessment and Plan  1. Annual Preventative/Screening Exam    2. Essential hypertension  - EKG 12-Lead - , RETROPERITNL ABD,  LTD - Urinalysis, Routine w reflex microscopic - Microalbumin / creatinine urine ratio - CBC with Differential/Platelet - COMPLETE METABOLIC PANEL WITH GFR -  Magnesium - TSH  3. Hyperlipidemia, mixed  - EKG 12-Lead - [place], RETROPERITNL ABD,  LTD - Lipid panel - TSH  4. Abnormal glucose  - EKG 12-Lead - [place], RETROPERITNL ABD,  LTD - Hemoglobin A1c - Insulin, random  5. Vitamin D deficiency  - VITAMIN D 25 Hydroxy   6. Testosterone deficiency  - Testosterone  7. Screening examination for pulmonary tuberculosis  - TB Skin Test  8. Prostate cancer screening  - PSA  9. Screening for colorectal cancer  - POC Hemoccult Bld/Stl   10. Screening for heart disease  - EKG 12-Lead  11. FHx: heart disease  - EKG 12-Lead - [place], RETROPERITNL ABD,  LTD  12. Screening for AAA (aortic abdominal aneurysm)  - [place], RETROPERITNL ABD,  LTD  13. Fatigue, unspecified type  - Iron, Total/Total Iron Binding Cap - Vitamin B12  14. Medication management - Urinalysis, Routine w reflex microscopic - Microalbumin / creatinine urine ratio -  Testosterone - CBC with Differential/Platelet - COMPLETE METABOLIC PANEL WITH GFR - Magnesium - Lipid panel - TSH - Hemoglobin A1c - Insulin, random - VITAMIN D 25 Hydroxy        Patient was counseled in prudent diet, weight control to achieve/maintain BMI less than 25, BP monitoring, regular exercise and medications as discussed.  Discussed med effects and SE's. Routine screening labs and tests as requested with regular follow-up as recommended. Over 40 minutes of exam, counseling, chart review and high complex critical decision making was performed   Zachary Maw, MD

## 2022-01-25 ENCOUNTER — Encounter: Payer: Self-pay | Admitting: Internal Medicine

## 2022-01-25 ENCOUNTER — Ambulatory Visit (INDEPENDENT_AMBULATORY_CARE_PROVIDER_SITE_OTHER): Payer: BC Managed Care – PPO | Admitting: Internal Medicine

## 2022-01-25 VITALS — BP 133/87 | HR 57 | Temp 96.4°F | Resp 16 | Ht 69.0 in | Wt 232.8 lb

## 2022-01-25 DIAGNOSIS — I700 Atherosclerosis of aorta: Secondary | ICD-10-CM

## 2022-01-25 DIAGNOSIS — Z0000 Encounter for general adult medical examination without abnormal findings: Secondary | ICD-10-CM

## 2022-01-25 DIAGNOSIS — Z79899 Other long term (current) drug therapy: Secondary | ICD-10-CM

## 2022-01-25 DIAGNOSIS — Z131 Encounter for screening for diabetes mellitus: Secondary | ICD-10-CM

## 2022-01-25 DIAGNOSIS — Z1211 Encounter for screening for malignant neoplasm of colon: Secondary | ICD-10-CM

## 2022-01-25 DIAGNOSIS — R350 Frequency of micturition: Secondary | ICD-10-CM | POA: Diagnosis not present

## 2022-01-25 DIAGNOSIS — Z0001 Encounter for general adult medical examination with abnormal findings: Secondary | ICD-10-CM

## 2022-01-25 DIAGNOSIS — Z130 Encounter for screening for diseases of the blood and blood-forming organs and certain disorders involving the immune mechanism: Secondary | ICD-10-CM

## 2022-01-25 DIAGNOSIS — R5383 Other fatigue: Secondary | ICD-10-CM

## 2022-01-25 DIAGNOSIS — R7309 Other abnormal glucose: Secondary | ICD-10-CM

## 2022-01-25 DIAGNOSIS — L649 Androgenic alopecia, unspecified: Secondary | ICD-10-CM

## 2022-01-25 DIAGNOSIS — E782 Mixed hyperlipidemia: Secondary | ICD-10-CM

## 2022-01-25 DIAGNOSIS — Z125 Encounter for screening for malignant neoplasm of prostate: Secondary | ICD-10-CM | POA: Diagnosis not present

## 2022-01-25 DIAGNOSIS — Z1389 Encounter for screening for other disorder: Secondary | ICD-10-CM | POA: Diagnosis not present

## 2022-01-25 DIAGNOSIS — N401 Enlarged prostate with lower urinary tract symptoms: Secondary | ICD-10-CM

## 2022-01-25 DIAGNOSIS — E349 Endocrine disorder, unspecified: Secondary | ICD-10-CM

## 2022-01-25 DIAGNOSIS — Z8249 Family history of ischemic heart disease and other diseases of the circulatory system: Secondary | ICD-10-CM

## 2022-01-25 DIAGNOSIS — F419 Anxiety disorder, unspecified: Secondary | ICD-10-CM

## 2022-01-25 DIAGNOSIS — E6609 Other obesity due to excess calories: Secondary | ICD-10-CM

## 2022-01-25 DIAGNOSIS — Z1329 Encounter for screening for other suspected endocrine disorder: Secondary | ICD-10-CM

## 2022-01-25 DIAGNOSIS — Z136 Encounter for screening for cardiovascular disorders: Secondary | ICD-10-CM

## 2022-01-25 DIAGNOSIS — Z111 Encounter for screening for respiratory tuberculosis: Secondary | ICD-10-CM

## 2022-01-25 DIAGNOSIS — E559 Vitamin D deficiency, unspecified: Secondary | ICD-10-CM | POA: Diagnosis not present

## 2022-01-25 DIAGNOSIS — Z13220 Encounter for screening for lipoid disorders: Secondary | ICD-10-CM

## 2022-01-25 DIAGNOSIS — I10 Essential (primary) hypertension: Secondary | ICD-10-CM

## 2022-01-25 MED ORDER — ALPRAZOLAM 1 MG PO TABS: ORAL_TABLET | ORAL | 0 refills | Status: DC

## 2022-01-25 MED ORDER — PHENTERMINE HCL 37.5 MG PO TABS: ORAL_TABLET | ORAL | 1 refills | Status: DC

## 2022-01-25 MED ORDER — TOPIRAMATE 50 MG PO TABS: ORAL_TABLET | ORAL | 1 refills | Status: DC

## 2022-01-25 MED ORDER — FINASTERIDE 5 MG PO TABS
ORAL_TABLET | ORAL | 3 refills | Status: DC
Start: 2022-01-25 — End: 2023-03-02

## 2022-01-25 NOTE — Addendum Note (Signed)
Addended by: Lucky Cowboy on: 01/25/2022 07:32 PM   Modules accepted: Orders

## 2022-01-26 LAB — URINALYSIS, ROUTINE W REFLEX MICROSCOPIC
Bilirubin Urine: NEGATIVE
Glucose, UA: NEGATIVE
Hgb urine dipstick: NEGATIVE
Ketones, ur: NEGATIVE
Leukocytes,Ua: NEGATIVE
Nitrite: NEGATIVE
Protein, ur: NEGATIVE
Specific Gravity, Urine: 1.012 (ref 1.001–1.035)
pH: <=5 (ref 5.0–8.0)

## 2022-01-26 LAB — COMPLETE METABOLIC PANEL WITH GFR
AG Ratio: 2 (calc) (ref 1.0–2.5)
ALT: 27 U/L (ref 9–46)
AST: 19 U/L (ref 10–35)
Albumin: 4.8 g/dL (ref 3.6–5.1)
Alkaline phosphatase (APISO): 50 U/L (ref 35–144)
BUN/Creatinine Ratio: 16 (calc) (ref 6–22)
BUN: 11 mg/dL (ref 7–25)
CO2: 26 mmol/L (ref 20–32)
Calcium: 9.9 mg/dL (ref 8.6–10.3)
Chloride: 102 mmol/L (ref 98–110)
Creat: 0.67 mg/dL — ABNORMAL LOW (ref 0.70–1.30)
Globulin: 2.4 g/dL (calc) (ref 1.9–3.7)
Glucose, Bld: 91 mg/dL (ref 65–99)
Potassium: 4.3 mmol/L (ref 3.5–5.3)
Sodium: 137 mmol/L (ref 135–146)
Total Bilirubin: 0.6 mg/dL (ref 0.2–1.2)
Total Protein: 7.2 g/dL (ref 6.1–8.1)
eGFR: 110 mL/min/{1.73_m2} (ref >=60)

## 2022-01-26 LAB — CBC WITH DIFFERENTIAL/PLATELET
Absolute Monocytes: 712 cells/uL (ref 200–950)
Basophils Absolute: 107 cells/uL (ref 0–200)
Basophils Relative: 1.2 %
Eosinophils Absolute: 365 cells/uL (ref 15–500)
Eosinophils Relative: 4.1 %
HCT: 42 % (ref 38.5–50.0)
Hemoglobin: 14.6 g/dL (ref 13.2–17.1)
Lymphs Abs: 2323 cells/uL (ref 850–3900)
MCH: 31.9 pg (ref 27.0–33.0)
MCHC: 34.8 g/dL (ref 32.0–36.0)
MCV: 91.7 fL (ref 80.0–100.0)
MPV: 9.8 fL (ref 7.5–12.5)
Monocytes Relative: 8 %
Neutro Abs: 5393 cells/uL (ref 1500–7800)
Neutrophils Relative %: 60.6 %
Platelets: 255 10*3/uL (ref 140–400)
RBC: 4.58 10*6/uL (ref 4.20–5.80)
RDW: 12.3 % (ref 11.0–15.0)
Total Lymphocyte: 26.1 %
WBC: 8.9 10*3/uL (ref 3.8–10.8)

## 2022-01-26 LAB — LIPID PANEL
Cholesterol: 132 mg/dL (ref <200)
HDL: 44 mg/dL (ref >=40)
LDL Cholesterol (Calc): 61 mg/dL (calc)
Non-HDL Cholesterol (Calc): 88 mg/dL (calc) (ref <130)
Total CHOL/HDL Ratio: 3 (calc) (ref <5.0)
Triglycerides: 194 mg/dL — ABNORMAL HIGH (ref <150)

## 2022-01-26 LAB — IRON, TOTAL/TOTAL IRON BINDING CAP
%SAT: 18 % (calc) — ABNORMAL LOW (ref 20–48)
Iron: 64 ug/dL (ref 50–180)
TIBC: 364 mcg/dL (calc) (ref 250–425)

## 2022-01-26 LAB — INSULIN, RANDOM: Insulin: 10.6 u[IU]/mL

## 2022-01-26 LAB — VITAMIN D 25 HYDROXY (VIT D DEFICIENCY, FRACTURES): Vit D, 25-Hydroxy: 79 ng/mL (ref 30–100)

## 2022-01-26 LAB — VITAMIN B12: Vitamin B-12: 333 pg/mL (ref 200–1100)

## 2022-01-26 LAB — HEMOGLOBIN A1C
Hgb A1c MFr Bld: 5.3 % of total Hgb (ref <5.7)
Mean Plasma Glucose: 105 mg/dL
eAG (mmol/L): 5.8 mmol/L

## 2022-01-26 LAB — PSA: PSA: 0.18 ng/mL (ref <=4.00)

## 2022-01-26 LAB — MICROALBUMIN / CREATININE URINE RATIO
Creatinine, Urine: 81 mg/dL (ref 20–320)
Microalb, Ur: <0.2 mg/dL

## 2022-01-26 LAB — TESTOSTERONE: Testosterone: 226 ng/dL — ABNORMAL LOW (ref 250–827)

## 2022-01-26 LAB — MAGNESIUM: Magnesium: 2.2 mg/dL (ref 1.5–2.5)

## 2022-01-26 LAB — TSH: TSH: 2.64 mIU/L (ref 0.40–4.50)

## 2022-01-26 NOTE — Progress Notes (Signed)
<><><><><><><><><><><><><><><><><><><><><><><><><><><><><><><><><> <><><><><><><><><><><><><><><><><><><><><><><><><><><><><><><><><> -   Test results slightly outside the reference range are not unusual. If there is anything important, I will review this with you,  otherwise it is considered normal test values.  If you have further questions,  please do not hesitate to contact me at the office or via My Chart.  <><><><><><><><><><><><><><><><><><><><><><><><><><><><><><><><><> <><><><><><><><><><><><><><><><><><><><><><><><><><><><><><><><><>  -  Iron is low - Recommend take an OTC Iron Supplement.  - Also  eat more Veggies with Iron as    Carrots, Beets ,   all leafy green veggies as Spinach, Collards,    Turnip - Mustard or Mixed Greens,   Kale, Asparagus, Broccoli, Brussel Sprouts,   Green Beans / peas, Soybeans,   Lentils, & Sweet Potatoes <><><><><><><><><><><><><><><><><><><><><><><><><><><><><><><><><>  - Testosterone = 226 - is Low  ( Ideal or Goal is between 450-850 )  -  Testosterone still low   -  Recommend take Zinc 50 mg tab which may help raise                                                                       Testosterone levels naturally   -  Also exercise 20-30 minutes  2 x /day will help raise Testosterone levels  -  Losing weight helps raise Testosterone levels as                                      fat tissue metabolizes Testosterone into Estrogen !  <><><><><><><><><><><><><><><><><><><><><><><><><><><><><><><><><>  - Total Chol = 132  -  Excellent   - Very low risk for Heart Attack  / Stroke <><><><><><><><><><><><><><><><><><><><><><><><><><><><><><><><><>  -  -  Vitamin B12 =     333 is  Very Low  !                                   (Ideal or Goal Vit B12 is between 450 - 1,100)   Low Vit B12 may be associated with Anemia , Fatigue, Impotence ,   Peripheral Neuropathy, Dementia, "Brain Fog", & Depression  - Recommend take a sub-lingual  form of Vitamin B12 tablet   1,000 to 5,000 mcg tab that you dissolve under your tongue /Daily   - Can get Lavonia Dana - best price at [employer] or on [employer] <><><><><><><><><><><><><><><><><><><><><><><><><><><><><><><><><>  - A1c - Normal - No Diabetes - Great ! <><><><><><><><><><><><><><><><><><><><><><><><><><><><><><><><><>  - Vitamin D = 79 - Excellent - please keep dose same  <><><><><><><><><><><><><><><><><><><><><><><><><><><><><><><><><>  - All Else - CBC - Kidneys - Electrolytes - Liver - Magnesium & Thyroid    - all  Normal / OK <><><><><><><><><><><><><><><><><><><><><><><><><><><><><><><><><> <><><><><><><><><><><><><><><><><><><><><><><><><><><><><><><><><>

## 2022-01-31 ENCOUNTER — Encounter: Payer: Self-pay | Admitting: Internal Medicine

## 2022-01-31 LAB — TB SKIN TEST
Induration: 0 mm
TB Skin Test: NEGATIVE

## 2022-04-23 ENCOUNTER — Other Ambulatory Visit: Payer: Self-pay | Admitting: Internal Medicine

## 2022-04-23 DIAGNOSIS — E782 Mixed hyperlipidemia: Secondary | ICD-10-CM

## 2022-04-23 DIAGNOSIS — I10 Essential (primary) hypertension: Secondary | ICD-10-CM

## 2022-04-27 ENCOUNTER — Encounter: Payer: Self-pay | Admitting: Nurse Practitioner

## 2022-04-27 ENCOUNTER — Ambulatory Visit: Payer: BC Managed Care – PPO | Admitting: Nurse Practitioner

## 2022-04-27 VITALS — BP 116/80 | HR 61 | Temp 97.3°F | Ht 69.0 in | Wt 230.0 lb

## 2022-04-27 DIAGNOSIS — I10 Essential (primary) hypertension: Secondary | ICD-10-CM | POA: Diagnosis not present

## 2022-04-27 DIAGNOSIS — B356 Tinea cruris: Secondary | ICD-10-CM

## 2022-04-27 DIAGNOSIS — F419 Anxiety disorder, unspecified: Secondary | ICD-10-CM | POA: Diagnosis not present

## 2022-04-27 DIAGNOSIS — E559 Vitamin D deficiency, unspecified: Secondary | ICD-10-CM

## 2022-04-27 DIAGNOSIS — R7303 Prediabetes: Secondary | ICD-10-CM

## 2022-04-27 DIAGNOSIS — E782 Mixed hyperlipidemia: Secondary | ICD-10-CM | POA: Diagnosis not present

## 2022-04-27 MED ORDER — CLOTRIMAZOLE-BETAMETHASONE 1-0.05 % EX CREA: TOPICAL_CREAM | CUTANEOUS | 1 refills | Status: AC

## 2022-04-27 NOTE — Progress Notes (Signed)
FOLLOW UP  Assessment and Plan:   Essential hypertension Discussed DASH (Dietary Approaches to Stop Hypertension) DASH diet is lower in sodium than a typical American diet. Cut back on foods that are high in saturated fat, cholesterol, and trans fats. Eat more whole-grain foods, fish, poultry, and nuts Remain active and exercise as tolerated daily.  Monitor BP at home-Call if greater than 130/80.    Hyperlipidemia, mixed Discussed lifestyle modifications. Recommended diet heavy in fruits and veggies, omega 3's. Decrease consumption of animal meats, cheeses, and dairy products. Remain active and exercise as tolerated. Continue to monitor.   Anxiety Reviewed relaxation techniques.  Sleep hygiene. Recommended mindfulness meditation and exercise.   Psychoeducation:  encouraged personality growth wand development through coping techniques and problem-solving skills. Limit/Decrease/Monitor drug/alcohol intake.     Vitamin D deficiency Continue supplement Monitor levels  Prediabetes Education: Reviewed 'ABCs' of diabetes management  Discussed goals to be met and/or maintained include A1C (<7) Blood pressure (<130/80) Cholesterol (LDL <70) Continue Eye Exam yearly  Continue Dental Exam Q6 mo Discussed dietary recommendations Discussed Physical Activity recommendations Monitor A1c   Tinea cruris Keep area dry and free of moisture. Monitor for increase in redness, pain, not responding to medication. Notify office if s/s fail to improve.  - clotrimazole-betamethasone (LOTRISONE) cream; Apply to affected area 2-4 x / daily  Dispense: 45 g; Refill: 1    Continue diet and meds as discussed. Further disposition pending results of labs. Discussed med's effects and SE's.    Over 20 minutes of exam, counseling, chart review, and critical decision making was performed.   Future Appointments  Date Time Provider [place]  08/01/2022  4:00 PM Unk Pinto, MD  [facility] None  01/30/2023  3:00 PM Unk Pinto, MD [facility] None    ----------------------------------------------------------------------------------------------------------------------  HPI 56 y.o. male  presents for 3 month follow up on hypertension, cholesterol, diabetes, weight and vitamin D deficiency.   Overall he reports feeling well.  Has had a break out of "jock itch."  Has been using topical clotrimazole and betamethasone 1% cream for tmt that is responsive.  Reports improvement but not well healed.  BMI is Body mass index is 33.97 kg/m., he has been working on diet and exercise. Wt Readings from Last 3 Encounters:  04/27/22 230 lb (104.3 kg)  01/25/22 232 lb 12.8 oz (105.6 kg)  01/25/21 225 lb (102.1 kg)    His blood pressure has been controlled at home, today their BP is BP: 116/80  He does workout. He denies chest pain, shortness of breath, dizziness.   He is on cholesterol medication Rosuvastatin and denies myalgias. His cholesterol is at goal. The cholesterol last visit was:   Lab Results  Component Value Date   CHOL 132 01/25/2022   HDL 44 01/25/2022   LDLCALC 61 01/25/2022   TRIG 194 (H) 01/25/2022   CHOLHDL 3.0 01/25/2022    He has been working on diet and exercise for prediabetes, and denies polydipsia and polyuria. Last A1C in the office was:  Lab Results  Component Value Date   HGBA1C 5.3 01/25/2022   Patient is on Vitamin D supplement.   Lab Results  Component Value Date   VD25OH 79 01/25/2022        Current Medications:  Current Outpatient Medications on File Prior to Visit  Medication Sig   ALPRAZolam (XANAX) 1 MG tablet Take  1/2 - 1 tablet  2 - 3 x /day  ONLY  if needed for Anxiety Attack &  limit to 5 days /week to avoid Addiction & Dementia   atenolol (TENORMIN) 100 MG tablet Take 1 tablet every Morning for BP   Cholecalciferol (VITAMIN D) 125 MCG (5000 UT) CAPS Take by mouth. Takes 2 capsules a week.   ezetimibe (ZETIA) 10 MG  tablet Take  1 tablet  Daily  for Cholesterol                                         /                           TAKE                                     BY                             MOUTH   finasteride (PROSCAR) 5 MG tablet Take 1 tablet Daily for Prostate   lisinopril (ZESTRIL) 20 MG tablet Take  1 tablet every Night for BP                                                   /                                  TAKE                                     BY                            MOUTH   phentermine (ADIPEX-P) 37.5 MG tablet Take 1/2 to 1 tablet every Morning for Dieting & Weight Loss   rosuvastatin (CRESTOR) 20 MG tablet Take  1 tablet  Daily  for Cholesterol                                        /                                         TAKE                                   BY                            MOUTH   topiramate (TOPAMAX) 50 MG tablet Take 1/2 to 1 tablet 2 x /day at Suppertime & Bedtime for Dieting & Weight Loss (Patient not taking: Reported on 04/27/2022)   No current facility-administered medications on file prior to visit.     Allergies: No Known Allergies  Medical History:  Past Medical History:  Diagnosis Date   Anxiety    Colitis, ulcerative ([place])    Hyperlipidemia    Hypertension    Hypogonadism male    Vitamin D deficiency    Family history- Reviewed and unchanged Social history- Reviewed and unchanged   Review of Systems:  ROS    Physical Exam: BP 116/80   Pulse 61   Temp (!) 97.3 F (36.3 C)   Ht 5' 9"  (1.753 m)   Wt 230 lb (104.3 kg)   SpO2 99%   BMI 33.97 kg/m  Wt Readings from Last 3 Encounters:  04/27/22 230 lb (104.3 kg)  01/25/22 232 lb 12.8 oz (105.6 kg)  01/25/21 225 lb (102.1 kg)   General Appearance: Well nourished, in no apparent distress. Eyes: PERRLA, EOMs, conjunctiva no swelling or erythema Sinuses: No Frontal/maxillary tenderness ENT/Mouth: Ext aud canals clear, TMs without erythema, bulging. No erythema, swelling, or  exudate on post pharynx.  Tonsils not swollen or erythematous. Hearing normal.  Neck: Supple, thyroid normal.  Respiratory: Respiratory effort normal, BS equal bilaterally without rales, rhonchi, wheezing or stridor.  Cardio: RRR with no MRGs. Brisk peripheral pulses without edema.  Abdomen: Soft, + BS.  Non tender, no guarding, rebound, hernias, masses. Lymphatics: Non tender without lymphadenopathy.  Musculoskeletal: Full ROM, 5/5 strength, Normal gait Skin: Warm, dry without rashes, lesions, ecchymosis.  Neuro: Cranial nerves intact. No cerebellar symptoms.  Psych: Awake and oriented X 3, normal affect, Insight and Judgment appropriate.    Darrol Jump, NP 4:05 PM [facility]

## 2022-07-14 DIAGNOSIS — J069 Acute upper respiratory infection, unspecified: Secondary | ICD-10-CM | POA: Diagnosis not present

## 2022-07-14 DIAGNOSIS — I10 Essential (primary) hypertension: Secondary | ICD-10-CM | POA: Diagnosis not present

## 2022-07-14 DIAGNOSIS — J018 Other acute sinusitis: Secondary | ICD-10-CM | POA: Diagnosis not present

## 2022-07-14 DIAGNOSIS — J209 Acute bronchitis, unspecified: Secondary | ICD-10-CM | POA: Diagnosis not present

## 2022-07-22 DIAGNOSIS — I10 Essential (primary) hypertension: Secondary | ICD-10-CM | POA: Diagnosis not present

## 2022-07-22 DIAGNOSIS — R059 Cough, unspecified: Secondary | ICD-10-CM | POA: Diagnosis not present

## 2022-07-22 DIAGNOSIS — Z6833 Body mass index (BMI) 33.0-33.9, adult: Secondary | ICD-10-CM | POA: Diagnosis not present

## 2022-07-22 DIAGNOSIS — J0141 Acute recurrent pansinusitis: Secondary | ICD-10-CM | POA: Diagnosis not present

## 2022-08-01 ENCOUNTER — Ambulatory Visit: Payer: BC Managed Care – PPO | Admitting: Internal Medicine

## 2023-01-30 ENCOUNTER — Ambulatory Visit (INDEPENDENT_AMBULATORY_CARE_PROVIDER_SITE_OTHER): Payer: BC Managed Care – PPO | Admitting: Internal Medicine

## 2023-01-30 ENCOUNTER — Other Ambulatory Visit: Payer: Self-pay | Admitting: Internal Medicine

## 2023-01-30 ENCOUNTER — Encounter: Payer: Self-pay | Admitting: Internal Medicine

## 2023-01-30 VITALS — BP 125/83 | HR 48 | Temp 97.9°F | Resp 16 | Ht 69.0 in | Wt 230.0 lb

## 2023-01-30 DIAGNOSIS — N401 Enlarged prostate with lower urinary tract symptoms: Secondary | ICD-10-CM

## 2023-01-30 DIAGNOSIS — Z1389 Encounter for screening for other disorder: Secondary | ICD-10-CM | POA: Diagnosis not present

## 2023-01-30 DIAGNOSIS — R5383 Other fatigue: Secondary | ICD-10-CM

## 2023-01-30 DIAGNOSIS — Z111 Encounter for screening for respiratory tuberculosis: Secondary | ICD-10-CM

## 2023-01-30 DIAGNOSIS — Z0000 Encounter for general adult medical examination without abnormal findings: Secondary | ICD-10-CM | POA: Diagnosis not present

## 2023-01-30 DIAGNOSIS — F5101 Primary insomnia: Secondary | ICD-10-CM

## 2023-01-30 DIAGNOSIS — Z1329 Encounter for screening for other suspected endocrine disorder: Secondary | ICD-10-CM

## 2023-01-30 DIAGNOSIS — Z136 Encounter for screening for cardiovascular disorders: Secondary | ICD-10-CM | POA: Diagnosis not present

## 2023-01-30 DIAGNOSIS — R350 Frequency of micturition: Secondary | ICD-10-CM | POA: Diagnosis not present

## 2023-01-30 DIAGNOSIS — Z130 Encounter for screening for diseases of the blood and blood-forming organs and certain disorders involving the immune mechanism: Secondary | ICD-10-CM

## 2023-01-30 DIAGNOSIS — D649 Anemia, unspecified: Secondary | ICD-10-CM | POA: Diagnosis not present

## 2023-01-30 DIAGNOSIS — Z8249 Family history of ischemic heart disease and other diseases of the circulatory system: Secondary | ICD-10-CM

## 2023-01-30 DIAGNOSIS — E782 Mixed hyperlipidemia: Secondary | ICD-10-CM

## 2023-01-30 DIAGNOSIS — E349 Endocrine disorder, unspecified: Secondary | ICD-10-CM

## 2023-01-30 DIAGNOSIS — Z131 Encounter for screening for diabetes mellitus: Secondary | ICD-10-CM

## 2023-01-30 DIAGNOSIS — E559 Vitamin D deficiency, unspecified: Secondary | ICD-10-CM | POA: Diagnosis not present

## 2023-01-30 DIAGNOSIS — I10 Essential (primary) hypertension: Secondary | ICD-10-CM

## 2023-01-30 DIAGNOSIS — R300 Dysuria: Secondary | ICD-10-CM

## 2023-01-30 DIAGNOSIS — Z13220 Encounter for screening for lipoid disorders: Secondary | ICD-10-CM | POA: Diagnosis not present

## 2023-01-30 DIAGNOSIS — R7309 Other abnormal glucose: Secondary | ICD-10-CM

## 2023-01-30 DIAGNOSIS — K5792 Diverticulitis of intestine, part unspecified, without perforation or abscess without bleeding: Secondary | ICD-10-CM

## 2023-01-30 DIAGNOSIS — Z0001 Encounter for general adult medical examination with abnormal findings: Secondary | ICD-10-CM

## 2023-01-30 DIAGNOSIS — Z1211 Encounter for screening for malignant neoplasm of colon: Secondary | ICD-10-CM

## 2023-01-30 DIAGNOSIS — Z125 Encounter for screening for malignant neoplasm of prostate: Secondary | ICD-10-CM

## 2023-01-30 DIAGNOSIS — Z79899 Other long term (current) drug therapy: Secondary | ICD-10-CM | POA: Diagnosis not present

## 2023-01-30 LAB — CBC WITH DIFFERENTIAL/PLATELET
Absolute Monocytes: 835 cells/uL (ref 200–950)
Basophils Absolute: 125 cells/uL (ref 0–200)
Basophils Relative: 1.3 %
Eosinophils Absolute: 432 cells/uL (ref 15–500)
Eosinophils Relative: 4.5 %
HCT: 41.7 % (ref 38.5–50.0)
Hemoglobin: 14 g/dL (ref 13.2–17.1)
Lymphs Abs: 3235 cells/uL (ref 850–3900)
MCH: 31.1 pg (ref 27.0–33.0)
MCHC: 33.6 g/dL (ref 32.0–36.0)
MCV: 92.7 fL (ref 80.0–100.0)
MPV: 10.1 fL (ref 7.5–12.5)
Monocytes Relative: 8.7 %
Neutro Abs: 4973 cells/uL (ref 1500–7800)
Neutrophils Relative %: 51.8 %
Platelets: 242 10*3/uL (ref 140–400)
RBC: 4.5 10*6/uL (ref 4.20–5.80)
RDW: 12.5 % (ref 11.0–15.0)
Total Lymphocyte: 33.7 %
WBC: 9.6 10*3/uL (ref 3.8–10.8)

## 2023-01-30 MED ORDER — CIPROFLOXACIN HCL 500 MG PO TABS: ORAL_TABLET | ORAL | 3 refills | Status: DC

## 2023-01-30 MED ORDER — METRONIDAZOLE 500 MG PO TABS
ORAL_TABLET | ORAL | 3 refills | Status: DC
Start: 2023-01-30 — End: 2023-01-30

## 2023-01-30 MED ORDER — TRAZODONE HCL 150 MG PO TABS
ORAL_TABLET | ORAL | 1 refills | Status: AC
Start: 2023-01-30 — End: ?

## 2023-01-30 NOTE — Patient Instructions (Signed)

## 2023-01-30 NOTE — Progress Notes (Signed)
Future Appointments  Date Time Provider Department Center  02/06/2024  3:00 PM Lucky Cowboy, MD [facility] None      Annual  Screening/Preventative Visit  & Comprehensive Evaluation & Examination   Future Appointments  Date Time Provider Department  02/06/2024  3:00 PM Lucky Cowboy, MD [facility]            This very nice 57 y.o. MWM presents for a Screening /Preventative Visit & comprehensive evaluation and management of multiple medical co-morbidities.  Patient has been followed for HTN, HLD, Prediabetes and Vitamin D Deficiency.       HTN predates  since 2003. Patient's BP has been controlled at home.  Today's BP is  at goal -  125/83 .   Patient denies any cardiac symptoms as chest pain, palpitations, shortness of breath, dizziness or ankle swelling.       Patient's hyperlipidemia is controlled with diet and Rosuvastatin /Ezetimibe. Patient denies myalgias or other medication SE's. Last lipids were at goal except elevated Trig's:  Lab Results  Component Value Date   CHOL 164 01/01/2020   HDL 43 01/01/2020   LDLCALC 87 01/01/2020   TRIG 245 (H) 01/01/2020   CHOLHDL 3.8 01/01/2020         Patient is monitored proactively  for glucose intolerance  and patient denies reactive hypoglycemic symptoms, visual blurring, diabetic polys or paresthesias. Last A1c was normal & at goal:   Lab Results  Component Value Date   HGBA1C 5.3 01/01/2020          Finally, patient has history of Vitamin D Deficiency ("20" /2008) and last vitamin D was at goal:   Lab Results  Component Value Date   VD25OH 50 01/01/2020    Current Outpatient Medications on File Prior to Visit  Medication Sig   ALPRAZolam 1 MG tablet Take    1/2 - 1 tablet     2 - 3 x /day     ONLY if needed    atenolol  100 MG tablet Take 1 tablet every Morning    VITAMIN D  5000 u Takes 2 capsules a week.   ezetimibe  10 MG tablet Take 1 tablet every day    finasteride 5 MG tablet TAKE 1 TABLET DAILY    lisinopril 20 MG tablet Take 1 tablet every Nite    rosuvastatin  20 MG tablet Take 1 tablet Daily    tamsulosin 0.4 MG CAPS Take 1 capsule    at Bedtime     No Known Allergies   Past Medical History:  Diagnosis Date   Anxiety    Colitis, ulcerative (HCC)    Hyperlipidemia    Hypertension    Hypogonadism male    Vitamin D deficiency      Health Maintenance  Topic Date Due   HIV Screening  Never done   Hepatitis C Screening  Never done   Zoster Vaccines- Shingrix (1 of 2) Never done   Pneumococcal Vaccine -23 06/06/2003   COVID-19 Vaccine (3 - Pfizer risk series) 11/18/2019   INFLUENZA VACCINE  02/01/2021   COLONOSCOPY  05/12/2023   TETANUS/TDAP  12/12/2028   HPV VACCINES  Aged Out     Immunization History  Administered Date(s) Administered   PFIZER SARS-COV-2 Vacc 09/30/2019, 10/21/2019   PPD Test 08/29/2014, 09/04/2015, 10/07/2016, 11/03/2017, 12/13/2018, 01/01/2020   Pneumococcal-23 06/05/2002   Td 12/13/2018   Tdap 07/06/2008    Last Colon - Nov 2019  - Dr Randa Evens - recc 5  year follow-up due Nov 2024  No past surgical history on file.   Family History  Problem Relation Age of Onset   Cancer Mother        ocular   Thyroid disease Mother    Hypertension Father    Hyperlipidemia Father     Social History   Socioeconomic History   Marital status: Married    Spouse name: Not on file   Number of children: None  Occupational History   Not on file  Tobacco Use   Smoking status: Never   Smokeless tobacco: Never  Substance and Sexual Activity   Alcohol use: Yes    Alcohol/week: 0.0 standard drinks    Comment: occasionally    ROS Constitutional: Denies fever, chills, weight loss/gain, headaches, insomnia,  night sweats or change in appetite. Does c/o fatigue. Eyes: Denies redness, blurred vision, diplopia, discharge, itchy or watery eyes.  ENT: Denies discharge, congestion, post nasal drip, epistaxis, sore throat, earache, hearing loss, dental pain,  Tinnitus, Vertigo, Sinus pain or snoring.  Cardio: Denies chest pain, palpitations, irregular heartbeat, syncope, dyspnea, diaphoresis, orthopnea, PND, claudication or edema Respiratory: denies cough, dyspnea, DOE, pleurisy, hoarseness, laryngitis or wheezing.  Gastrointestinal: Denies dysphagia, heartburn, reflux, water brash, pain, cramps, nausea, vomiting, bloating, diarrhea, constipation, hematemesis, melena, hematochezia, jaundice or hemorrhoids Genitourinary: Denies dysuria, frequency, urgency, nocturia, hesitancy, discharge, hematuria or flank pain Musculoskeletal: Denies arthralgia, myalgia, stiffness, Jt. Swelling, pain, limp or strain/sprain. Denies Falls. Skin: Denies puritis, rash, hives, warts, acne, eczema or change in skin lesion Neuro: No weakness, tremor, incoordination, spasms, paresthesia or pain Psychiatric: Denies confusion, memory loss or sensory loss. Denies Depression. Endocrine: Denies change in weight, skin, hair change, nocturia, and paresthesia, diabetic polys, visual blurring or hyper / hypo glycemic episodes.  Heme/Lymph: No excessive bleeding, bruising or enlarged lymph nodes.   Physical Exam  BP 125/83   Pulse (!) 48   Temp 97.9 F (36.6 C)   Resp 16   Ht 5\' 9"  (1.753 m)   Wt 230 lb (104.3 kg)   SpO2 98%   BMI 33.97 kg/m   General Appearance: Well nourished and well groomed and in no apparent distress.  Eyes: PERRLA, EOMs, conjunctiva no swelling or erythema, normal fundi and vessels. Sinuses: No frontal/maxillary tenderness ENT/Mouth: EACs patent / TMs  nl. Nares clear without erythema, swelling, mucoid exudates. Oral hygiene is good. No erythema, swelling, or exudate. Tongue normal, non-obstructing. Tonsils not swollen or erythematous. Hearing normal.  Neck: Supple, thyroid not palpable. No bruits, nodes or JVD. Respiratory: Respiratory effort normal.  BS equal and clear bilateral without rales, rhonci, wheezing or stridor. Cardio: Heart sounds are  normal with regular rate and rhythm and no murmurs, rubs or gallops. Peripheral pulses are normal and equal bilaterally without edema. No aortic or femoral bruits. Chest: symmetric with normal excursions and percussion.  Abdomen: Soft, with Nl bowel sounds. Nontender, no guarding, rebound, hernias, masses, or organomegaly.  Lymphatics: Non tender without lymphadenopathy.  Musculoskeletal: Full ROM all peripheral extremities, joint stability, 5/5 strength, and normal gait. Skin: Warm and dry without rashes, lesions, cyanosis, clubbing or  ecchymosis.  Neuro: Cranial nerves intact, reflexes equal bilaterally. Normal muscle tone, no cerebellar symptoms. Sensation intact.  Pysch: Alert and oriented x 3 with normal affect, insight and judgment appropriate.   Assessment and Plan  1. Annual Preventative/Screening Exam    2. Essential hypertension  - EKG 12-Lead - [place], RETROPERITNL ABD,  LTD - Urinalysis, Routine w reflex microscopic - Microalbumin / creatinine  urine ratio - CBC with Differential/Platelet - COMPLETE METABOLIC PANEL WITH GFR - Magnesium - TSH  3. Hyperlipidemia, mixed  - EKG 12-Lead - [place], RETROPERITNL ABD,  LTD - Lipid panel - TSH  4. Abnormal glucose  - EKG 12-Lead - [place], RETROPERITNL ABD,  LTD - Hemoglobin A1c - Insulin, random  5. Vitamin D deficiency  - VITAMIN D 25 Hydroxy   6. Testosterone deficiency  - Testosterone  7. Screening examination for pulmonary tuberculosis  - TB Skin Test  8. Prostate cancer screening  - PSA  9. Screening for colorectal cancer  - POC Hemoccult Bld/Stl   10. Screening for heart disease  - EKG 12-Lead  11. FHx: heart disease  - EKG 12-Lead - [place], RETROPERITNL ABD,  LTD  12. Screening for AAA (aortic abdominal aneurysm)  - [place], RETROPERITNL ABD,  LTD  13. Fatigue, unspecified type  - Iron, Total/Total Iron Binding Cap - Vitamin B12  14. Medication management - Urinalysis, Routine w reflex microscopic -  Microalbumin / creatinine urine ratio - Testosterone - CBC with Differential/Platelet - COMPLETE METABOLIC PANEL WITH GFR - Magnesium - Lipid panel - TSH - Hemoglobin A1c - Insulin, random - VITAMIN D 25 Hydroxy        Patient was counseled in prudent diet, weight control to achieve/maintain BMI less than 25, BP monitoring, regular exercise and medications as discussed.  Discussed med effects and SE's. Routine screening labs and tests as requested with regular follow-up as recommended. Over 40 minutes of exam, counseling, chart review and high complex critical decision making was performed   Marinus Maw, MD

## 2023-01-31 NOTE — Progress Notes (Signed)
^<^<^<^<^<^<^<^<^<^<^<^<^<^<^<^<^<^<^<^<^<^<^<^<^<^<^<^<^<^<^<^<^<^<^<^<^ ^>^>^>^>^>^>^>^>^>^>^>>^>^>^>^>^>^>^>^>^>^>^>^>^>^>^>^>^>^>^>^>^>^>^>^>^>  -Test results slightly outside the reference range are not unusual. If there is anything important, I will review this with you,  otherwise it is considered normal test values.  If you have further questions,  please do not hesitate to contact me at the office or via My Chart.   ^<^<^<^<^<^<^<^<^<^<^<^<^<^<^<^<^<^<^<^<^<^<^<^<^<^<^<^<^<^<^<^<^<^<^<^<^ ^>^>^>^>^>^>^>^>^>^>^>^>^>^>^>^>^>^>^>^>^>^>^>^>^>^>^>^>^>^>^>^>^>^>^>^>^  -  Testosterone is still low   9 Ways to Naturally Increase Testosterone Levels  1.   Lose Weight  If you're overweight, shedding the excess pounds may increase your testosterone levels, according to research presented at the Endocrine Society's 2012 meeting. Overweight men are more likely to have low testosterone levels to begin with, so this is an important trick to increase your body's testosterone production when you need it most.  Also, when you are Overweight, your fat tissue converts your Testosterone into Estrogen ,                                                                                                                                   which " feminizes" you !   2.   High-Intensity Exercise like Peak [employer]itness   Short intense exercise has a proven positive effect on increasing testosterone levels and preventing its decline. That's unlike aerobics or prolonged moderate exercise, which have shown to have negative or no effect on testosterone levels. Having a whey protein meal after exercise can further enhance the satiety/testosterone-boosting impact (hunger hormones cause the opposite effect on your testosterone and libido). Here's a summary of what a typical high-intensity [employer] routine might look like: " Warm up for three minutes  " Exercise as hard and fast as you can for 30 seconds. You should  feel like you couldn't possibly go on another few seconds  " Recover at a slow to moderate pace for 90 seconds  " Repeat the high intensity exercise and recovery 7 more times .  3.   Consume Plenty of Zinc  The mineral zinc is important for testosterone production, and supplementing your diet for as little as six weeks has been shown to cause a marked improvement in testosterone among men with low levels.1 Likewise, research has shown that restricting dietary sources of zinc leads to a significant decrease in testosterone, while zinc supplementation increases it - and even protects men from exercised-induced reductions in testosterone levels.  It's estimated that up to 45 percent of adults over the age of 60 may have lower than recommended zinc intakes; even when dietary supplements were added in, an estimated 20-25 percent of older adults still had inadequate zinc intakes, according to a [employer] and Nutrition Examination Survey.4 Your diet is the best source of zinc; along with protein-rich foods like  fish, other good dietary sources of zinc include raw milk, raw cheese, beans, and yogurt or kefir made from raw milk. It can be difficult to obtain enough dietary zinc if you're a vegetarian, and also for  meat-eaters as well, largely because of conventional farming methods that rely heavily on chemical fertilizers and pesticides. These chemicals deplete the soil of nutrients ... nutrients like zinc that must be absorbed by plants in order to be passed on to you. In many cases, you may further deplete the nutrients in your food by the way you prepare it. For most food, cooking it will drastically reduce its levels of nutrients like zinc ... particularly over-cooking, which many people do. If you decide to use a zinc supplement, stick to a dosage of 50 mg a day, as this is the recommended adult dose. Taking too much zinc can interfere with your body's ability to absorb other minerals, especially  copper, and may cause nausea as a side effect.  4.   Strength Training  In addition to [employer] Fitness, strength training is also known to boost testosterone levels, provided you are doing so intensely enough. When strength training to boost testosterone, you'll want to increase the weight and lower your number of reps, and then focus on exercises that work a large number of muscles, such as dead lifts or squats.  You can "turbo-charge" your weight training by going slower. By slowing down your movement, you're actually turning it into a high-intensity exercise. Super Slow movement allows your muscle, at the microscopic level, to access the maximum number of cross-bridges between the protein filaments that produce movement in the muscle.   5.   Optimize Your Vitamin D Levels  Vitamin D, a steroid hormone, is essential for the healthy development of the nucleus of the sperm cell, and helps maintain semen quality and sperm count. Vitamin D also increases levels of testosterone, which may boost libido. In one study, overweight men who were given vitamin D supplements had a significant increase in testosterone levels after one year.5   6.   Reduce Stress  When you're under a lot of stress, your body releases high levels of the stress hormone cortisol. This hormone actually blocks the effects of testosterone,6 presumably because, from a biological standpoint, testosterone-associated behaviors (mating, competing, aggression) may have lowered your chances of survival in an emergency (hence, the "fight or flight" response is dominant, courtesy of cortisol).  7.   Limit or Eliminate Sugar from Your Diet  Testosterone levels decrease after you eat sugar, which is likely because the sugar leads to a high insulin level, another factor leading to low testosterone.7 Based on USDA estimates, the average American consumes 12 teaspoons of sugar a day, which equates to about TWO TONS of sugar during a lifetime.  8.    Eat Healthy Fats  By healthy, this means not only mono- and polyunsaturated fats, like that found in avocadoes and nuts, but also saturated, as these are essential for building testosterone. Research shows that a diet with less than 40 percent of energy as fat (and that mainly from animal sources, i.e. saturated) lead to a decrease in testosterone levels. ie eat less animal products - as Meat , poultry and dairy. Experts agree that the ideal diet includes somewhere between 50-70 percent fat.  It's important to understand that your body requires saturated fats from animal and vegetable sources (such as meat, dairy, certain oils, and tropical plants like coconut) for optimal functioning, and if you neglect this important food group in favor of sugar, grains and other starchy carbs, your health and weight are almost guaranteed to suffer. Examples of healthy fats you can eat more of to give your testosterone levels a  boost include: Olives and Olive oil  Coconuts and coconut oil Butter made from raw grass-fed organic milk Raw nuts, such as, almonds or pecans Organic pastured egg yolks Avocados Grass-fed meats Palm oil Unheated organic nut oils  9.   Boost Your Intake of Branch Chain Amino Acids (BCAA) from Foods Like Whey Protein Research suggests that BCAAs result in higher testosterone levels, particularly when taken along with resistance training. While BCAAs are available in supplement form, you'll find the highest concentrations of BCAAs like leucine in whey protein. Even when getting leucine from your natural food supply, it's often wasted or used as a building block instead of an anabolic agent. So to create the correct anabolic environment, you need to boost leucine consumption way beyond mere maintenance levels. That said, keep in mind that using leucine as a free form amino acid can be highly counterproductive as when free form amino acids are artificially administrated, they rapidly enter your  circulation while disrupting insulin function, and impairing your body's glycemic control. Food-based leucine is really the ideal form that can benefit your muscles without side effects.  ^>^>^>^>^>^>^>^>^>^>^>^>^>^>^>^>^>^>^>^>^>^>^>^>^>^>^>^>^>^>^>^>^>^>^>^>^ ^>^>^>^>^>^>^>^>^>^>^>^>^>^>^>^>^>^>^>^>^>^>^>^>^>^>^>^>^>^>^>^>^>^>^>^>^  -  Chol = 107  -  Excellent   - Very low risk for Heart Attack  / Stroke  ^>^>^>^>^>^>^>^>^>^>^>^>^>^>^>^>^>^>^>^>^>^>^>^>^>^>^>^>^>^>^>^>^>^>^>^>^ ^>^>^>^>^>^>^>^>^>^>^>^>^>^>^>^>^>^>^>^>^>^>^>^>^>^>^>^>^>^>^>^>^>^>^>^>^  -  Vitamin D = 102 - Excellent - Please keep dose same   ^>^>^>^>^>^>^>^>^>^>^>^>^>^>^>^>^>^>^>^>^>^>^>^>^>^>^>^>^>^>^>^>^>^>^>^>^ ^>^>^>^>^>^>^>^>^>^>^>^>^>^>^>^>^>^>^>^>^>^>^>^>^>^>^>^>^>^>^>^>^>^>^>^>^  -  Iron levels are OK, But   -  Vitamin B12 =  350     Very Low  (Ideal or Goal Vit B12 is between 450 - 1,100)   Low Vit B12 may be associated with Anemia , Fatigue,   Peripheral Neuropathy, Dementia, "Brain Fog", & Depression  - Recommend take a sub-lingual form of Vitamin B12 tablet   1,000 to 5,000 mcg tab that you dissolve under your tongue /Daily   - Can get Lavonia Dana - best price at [employer] or on [employer]  ^>^>^>^>^>^>^>^>^>^>^>^>^>^>^>^>^>^>^>^>^>^>^>^>^>^>^>^>^>^>^>^>^>^>^>^>^  -  PSA - very Low - No Prostate cancer  - Great !   ^>^>^>^>^>^>^>^>^>^>^>^>^>^>^>^>^>^>^>^>^>^>^>^>^>^>^>^>^>^>^>^>^>^>^>^>^ ^>^>^>^>^>^>^>^>^>^>^>^>^>^>^>^>^>^>^>^>^>^>^>^>^>^>^>^>^>^>^>^>^>^>^>^>^  - All Else - CBC - Kidneys - Electrolytes - Liver - Magnesium & Thyroid    - all  Normal / OK ^>^>^>^>^>^>^>^>^>^>^>^>^>^>^>^>^>^>^>^>^>^>^>^>^>^>^>^>^>^>^>^>^>^>^>^>^ ^>^>^>^>^>^>^>^>^>^>^>^>^>^>^>^>^>^>^>^>^>^>^>^>^>^>^>^>^>^>^>^>^>^>^>^>^

## 2023-03-02 ENCOUNTER — Other Ambulatory Visit: Payer: Self-pay | Admitting: Internal Medicine

## 2023-03-02 DIAGNOSIS — N401 Enlarged prostate with lower urinary tract symptoms: Secondary | ICD-10-CM

## 2023-03-02 DIAGNOSIS — L649 Androgenic alopecia, unspecified: Secondary | ICD-10-CM

## 2023-04-25 ENCOUNTER — Encounter: Payer: Self-pay | Admitting: Nurse Practitioner

## 2023-05-04 ENCOUNTER — Ambulatory Visit: Payer: BC Managed Care – PPO | Admitting: Nurse Practitioner

## 2023-05-04 ENCOUNTER — Encounter: Payer: Self-pay | Admitting: Nurse Practitioner

## 2023-05-04 VITALS — BP 120/80 | HR 63 | Temp 97.6°F | Ht 69.0 in | Wt 232.4 lb

## 2023-05-04 DIAGNOSIS — I10 Essential (primary) hypertension: Secondary | ICD-10-CM

## 2023-05-04 DIAGNOSIS — R7303 Prediabetes: Secondary | ICD-10-CM

## 2023-05-04 DIAGNOSIS — E559 Vitamin D deficiency, unspecified: Secondary | ICD-10-CM

## 2023-05-04 DIAGNOSIS — E782 Mixed hyperlipidemia: Secondary | ICD-10-CM

## 2023-05-04 DIAGNOSIS — F419 Anxiety disorder, unspecified: Secondary | ICD-10-CM

## 2023-05-04 DIAGNOSIS — R7309 Other abnormal glucose: Secondary | ICD-10-CM

## 2023-05-04 DIAGNOSIS — E349 Endocrine disorder, unspecified: Secondary | ICD-10-CM

## 2023-05-04 DIAGNOSIS — N521 Erectile dysfunction due to diseases classified elsewhere: Secondary | ICD-10-CM

## 2023-05-04 MED ORDER — TADALAFIL 10 MG PO TABS: 10.0000 mg | ORAL_TABLET | ORAL | 1 refills | Status: AC | PRN

## 2023-05-04 NOTE — Progress Notes (Signed)
FOLLOW UP  Assessment and Plan:   Essential hypertension Discussed DASH (Dietary Approaches to Stop Hypertension) DASH diet is lower in sodium than a typical [place] diet. Cut back on foods that are high in saturated fat, cholesterol, and trans fats. Eat more whole-grain foods, fish, poultry, and nuts Remain active and exercise as tolerated daily.  Monitor BP at home-Call if greater than 130/80.   Hyperlipidemia, mixed Discussed lifestyle modifications. Recommended diet heavy in fruits and veggies, omega 3's. Decrease consumption of animal meats, cheeses, and dairy products. Remain active and exercise as tolerated. Continue to monitor.  Anxiety Reviewed relaxation techniques.  Sleep hygiene. Recommended mindfulness meditation and exercise.   Psychoeducation:  encouraged personality growth wand development through coping techniques and problem-solving skills. Limit/Decrease/Monitor drug/alcohol intake.    Vitamin D deficiency Continue supplement Monitor levels  Prediabetes Education: Reviewed 'ABCs' of diabetes management  Discussed goals to be met and/or maintained include A1C (<7) Blood pressure (<130/80) Cholesterol (LDL <70) Continue Eye Exam yearly  Continue Dental Exam Q6 mo Discussed dietary recommendations Discussed Physical Activity recommendations Monitor A1c  Testosterone deficiency/ED Continue tadalafil Continue Zinc supplement Continue to monitor  Review of blood work stable - patient obtains yearly - defers today.     Notify office for further evaluation and treatment, questions or concerns if any reported s/s fail to improve.   The patient was advised to call back or seek an in-person evaluation if any symptoms worsen or if the condition fails to improve as anticipated.   Further disposition pending results of labs. Discussed med's effects and SE's.    I discussed the assessment and treatment plan with the patient. The patient was provided an  opportunity to ask questions and all were answered. The patient agreed with the plan and demonstrated an understanding of the instructions.  Discussed med's effects and SE's. Screening labs and tests as requested with regular follow-up as recommended.  I provided 30 minutes of face-to-face time during this encounter including counseling, chart review, and critical decision making was preformed.  Today's Plan of Care is based on a patient-centered health care approach known as shared decision making - the decisions, tests and treatments allow for patient preferences and values to be balanced with clinical evidence.     Future Appointments  Date Time Provider Department Center  08/08/2023  4:00 PM Lucky Cowboy, MD [facility] None  02/06/2024  3:00 PM Lucky Cowboy, MD [facility] None    ----------------------------------------------------------------------------------------------------------------------  HPI 57 y.o. male  presents for 3 month follow up on hypertension, cholesterol, diabetes, weight and vitamin D deficiency.   Overall he reports feeling well.  He has no new concerns at this time.  BMI is Body mass index is 34.32 kg/m., he has been working on diet and exercise. Wt Readings from Last 3 Encounters:  05/04/23 232 lb 6.4 oz (105.4 kg)  01/30/23 230 lb (104.3 kg)  04/27/22 230 lb (104.3 kg)   His blood pressure has been controlled at home, today their BP is BP: 120/80  He does workout. He denies chest pain, shortness of breath, dizziness.   He is on cholesterol medication Rosuvastatin and denies myalgias. His cholesterol is at goal. The cholesterol last visit was:   Lab Results  Component Value Date   CHOL 107 01/30/2023   HDL 39 (L) 01/30/2023   LDLCALC 42 01/30/2023   TRIG 181 (H) 01/30/2023   CHOLHDL 2.7 01/30/2023    He has been working on diet and exercise for prediabetes, and denies polydipsia  and polyuria. Last A1C in the office was:  Lab Results   Component Value Date   HGBA1C 5.6 01/30/2023   Patient is on Vitamin D supplement.   Lab Results  Component Value Date   VD25OH 102 (H) 01/30/2023     He has a hx of testosterone deficiency and ED.  Treated with tadalafil and effective.  Last testosterone:  Lab Results  Component Value Date   TESTOSTERONE 205 (L) 01/30/2023     Current Medications:  Current Outpatient Medications on File Prior to Visit  Medication Sig   Cholecalciferol (VITAMIN D) 125 MCG (5000 UT) CAPS Take by mouth. Takes 2 capsules a week.   Cyanocobalamin (B-12 PO) Take by mouth daily.   ezetimibe (ZETIA) 10 MG tablet Take  1 tablet  Daily  for Cholesterol                                         /                           TAKE                                     BY                             MOUTH   finasteride (PROSCAR) 5 MG tablet TAKE 1 TABLET BY MOUTH DAILY FOR PROSTATE   lisinopril (ZESTRIL) 20 MG tablet Take  1 tablet every Night for BP                                                   /                                  TAKE                                     BY                            MOUTH   rosuvastatin (CRESTOR) 20 MG tablet Take  1 tablet  Daily  for Cholesterol                                        /                                         TAKE                                   BY  MOUTH   Zinc 50 MG TABS Take by mouth.   traZODone (DESYREL) 150 MG tablet Take 1/2 to 1 tablet 1 to 2 hours before Bedtime as needed for Sleep (Patient not taking: Reported on 05/04/2023)   No current facility-administered medications on file prior to visit.     Allergies: No Known Allergies   Medical History:  Past Medical History:  Diagnosis Date   Anxiety    Colitis, ulcerative (HCC)    Hyperlipidemia    Hypertension    Hypogonadism male    Vitamin D deficiency    Family history- Reviewed and unchanged Social history- Reviewed and unchanged   Review of Systems: A complete  ROS was performed with pertinent positives/negatives noted in the HPI. The remainder of the ROS are negative.  Physical Exam: BP 120/80   Pulse 63   Temp 97.6 F (36.4 C)   Ht 5\' 9"  (1.753 m)   Wt 232 lb 6.4 oz (105.4 kg)   SpO2 98%   BMI 34.32 kg/m  Wt Readings from Last 3 Encounters:  05/04/23 232 lb 6.4 oz (105.4 kg)  01/30/23 230 lb (104.3 kg)  04/27/22 230 lb (104.3 kg)   General Appearance: Well nourished, in no apparent distress. Eyes: PERRLA, EOMs, conjunctiva no swelling or erythema Sinuses: No Frontal/maxillary tenderness ENT/Mouth: Ext aud canals clear, TMs without erythema, bulging. No erythema, swelling, or exudate on post pharynx.  Tonsils not swollen or erythematous. Hearing normal.  Neck: Supple, thyroid normal.  Respiratory: Respiratory effort normal, BS equal bilaterally without rales, rhonchi, wheezing or stridor.  Cardio: RRR with no MRGs. Brisk peripheral pulses without edema.  Abdomen: Soft, + BS.  Non tender, no guarding, rebound, hernias, masses. Lymphatics: Non tender without lymphadenopathy.  Musculoskeletal: Full ROM, 5/5 strength, Normal gait Skin: Warm, dry without rashes, lesions, ecchymosis.  Neuro: Cranial nerves intact. No cerebellar symptoms.  Psych: Awake and oriented X 3, normal affect, Insight and Judgment appropriate.    Adela Glimpse, NP 1:36 PM [facility]

## 2023-05-10 NOTE — Patient Instructions (Signed)

## 2023-05-30 ENCOUNTER — Other Ambulatory Visit: Payer: Self-pay | Admitting: Internal Medicine

## 2023-05-30 DIAGNOSIS — E782 Mixed hyperlipidemia: Secondary | ICD-10-CM

## 2023-05-31 ENCOUNTER — Other Ambulatory Visit: Payer: Self-pay | Admitting: Internal Medicine

## 2023-05-31 DIAGNOSIS — E782 Mixed hyperlipidemia: Secondary | ICD-10-CM

## 2023-08-08 ENCOUNTER — Ambulatory Visit: Payer: BC Managed Care – PPO | Admitting: Internal Medicine

## 2023-11-14 DIAGNOSIS — K51819 Other ulcerative colitis with unspecified complications: Secondary | ICD-10-CM | POA: Diagnosis not present

## 2024-01-24 DIAGNOSIS — L821 Other seborrheic keratosis: Secondary | ICD-10-CM | POA: Diagnosis not present

## 2024-02-06 ENCOUNTER — Encounter: Payer: BC Managed Care – PPO | Admitting: Internal Medicine

## 2024-02-07 DIAGNOSIS — K6389 Other specified diseases of intestine: Secondary | ICD-10-CM | POA: Diagnosis not present

## 2024-02-07 DIAGNOSIS — K5190 Ulcerative colitis, unspecified, without complications: Secondary | ICD-10-CM | POA: Diagnosis not present

## 2024-06-09 ENCOUNTER — Other Ambulatory Visit: Payer: Self-pay | Admitting: Nurse Practitioner

## 2024-06-09 DIAGNOSIS — E782 Mixed hyperlipidemia: Secondary | ICD-10-CM
